# Patient Record
Sex: Female | Born: 1940 | Race: White | Hispanic: No | Marital: Married | Smoking: Never smoker
Health system: Southern US, Community
[De-identification: ages and names within clinical notes are randomized; demographics above are authoritative.]

## PROBLEM LIST (undated history)

## (undated) DIAGNOSIS — I341 Nonrheumatic mitral (valve) prolapse: Secondary | ICD-10-CM

## (undated) DIAGNOSIS — E039 Hypothyroidism, unspecified: Secondary | ICD-10-CM

## (undated) DIAGNOSIS — B029 Zoster without complications: Secondary | ICD-10-CM

## (undated) DIAGNOSIS — I10 Essential (primary) hypertension: Secondary | ICD-10-CM

## (undated) DIAGNOSIS — IMO0002 Reserved for concepts with insufficient information to code with codable children: Secondary | ICD-10-CM

## (undated) DIAGNOSIS — R011 Cardiac murmur, unspecified: Secondary | ICD-10-CM

## (undated) DIAGNOSIS — Z8619 Personal history of other infectious and parasitic diseases: Secondary | ICD-10-CM

## (undated) HISTORY — DX: Personal history of other infectious and parasitic diseases: Z86.19

## (undated) HISTORY — PX: THYROIDECTOMY: SHX17

## (undated) HISTORY — DX: Cardiac murmur, unspecified: R01.1

## (undated) HISTORY — DX: Reserved for concepts with insufficient information to code with codable children: IMO0002

## (undated) HISTORY — PX: SQUAMOUS CELL CARCINOMA EXCISION: SHX2433

## (undated) HISTORY — PX: EYE SURGERY: SHX253

## (undated) HISTORY — PX: BREAST CYST ASPIRATION: SHX578

## (undated) HISTORY — DX: Zoster without complications: B02.9

---

## 1973-01-24 DIAGNOSIS — E890 Postprocedural hypothyroidism: Secondary | ICD-10-CM | POA: Insufficient documentation

## 2004-04-08 ENCOUNTER — Ambulatory Visit: Payer: Self-pay | Admitting: Family Medicine

## 2005-04-11 ENCOUNTER — Ambulatory Visit: Payer: Self-pay | Admitting: Family Medicine

## 2006-02-01 ENCOUNTER — Ambulatory Visit: Payer: Self-pay | Admitting: Gastroenterology

## 2006-02-01 LAB — HM COLONOSCOPY

## 2006-05-01 ENCOUNTER — Ambulatory Visit: Payer: Self-pay | Admitting: Family Medicine

## 2006-05-03 ENCOUNTER — Ambulatory Visit: Payer: Self-pay | Admitting: Family Medicine

## 2006-11-06 ENCOUNTER — Ambulatory Visit: Payer: Self-pay | Admitting: Family Medicine

## 2007-03-20 DIAGNOSIS — K2100 Gastro-esophageal reflux disease with esophagitis, without bleeding: Secondary | ICD-10-CM | POA: Insufficient documentation

## 2007-04-27 ENCOUNTER — Ambulatory Visit: Payer: Self-pay | Admitting: Family Medicine

## 2007-05-02 ENCOUNTER — Ambulatory Visit: Payer: Self-pay | Admitting: Family Medicine

## 2007-06-13 ENCOUNTER — Ambulatory Visit: Payer: Self-pay | Admitting: Gastroenterology

## 2007-06-13 HISTORY — PX: UPPER GI ENDOSCOPY: SHX6162

## 2007-12-05 DIAGNOSIS — M8580 Other specified disorders of bone density and structure, unspecified site: Secondary | ICD-10-CM | POA: Insufficient documentation

## 2008-05-06 ENCOUNTER — Ambulatory Visit: Payer: Self-pay | Admitting: Family Medicine

## 2008-12-12 DIAGNOSIS — B029 Zoster without complications: Secondary | ICD-10-CM | POA: Insufficient documentation

## 2008-12-12 HISTORY — DX: Zoster without complications: B02.9

## 2009-05-07 ENCOUNTER — Ambulatory Visit: Payer: Self-pay | Admitting: Family Medicine

## 2010-05-11 ENCOUNTER — Ambulatory Visit: Payer: Self-pay | Admitting: Family Medicine

## 2011-05-24 ENCOUNTER — Ambulatory Visit: Payer: Self-pay | Admitting: Family Medicine

## 2011-09-16 LAB — LIPID PANEL
Cholesterol: 234 mg/dL — AB (ref 0–200)
HDL: 120 mg/dL — AB (ref 35–70)
LDL Cholesterol: 98 mg/dL
Triglycerides: 81 mg/dL (ref 40–160)

## 2012-05-04 ENCOUNTER — Ambulatory Visit: Payer: Self-pay | Admitting: Family Medicine

## 2012-05-24 ENCOUNTER — Ambulatory Visit: Payer: Self-pay | Admitting: Family Medicine

## 2013-01-10 ENCOUNTER — Ambulatory Visit: Payer: Self-pay | Admitting: Family Medicine

## 2013-01-10 LAB — HM DEXA SCAN

## 2013-05-27 ENCOUNTER — Ambulatory Visit: Payer: Self-pay | Admitting: Family Medicine

## 2014-03-20 LAB — BASIC METABOLIC PANEL
BUN: 11 mg/dL (ref 4–21)
CREATININE: 1 mg/dL (ref 0.5–1.1)
GLUCOSE: 90 mg/dL
POTASSIUM: 4.3 mmol/L (ref 3.4–5.3)
SODIUM: 140 mmol/L (ref 137–147)

## 2014-03-20 LAB — TSH: TSH: 3.4 u[IU]/mL (ref 0.41–5.90)

## 2014-05-09 ENCOUNTER — Other Ambulatory Visit: Payer: Self-pay | Admitting: Family Medicine

## 2014-05-09 DIAGNOSIS — Z1231 Encounter for screening mammogram for malignant neoplasm of breast: Secondary | ICD-10-CM

## 2014-05-29 ENCOUNTER — Ambulatory Visit
Admission: RE | Admit: 2014-05-29 | Discharge: 2014-05-29 | Disposition: A | Discharge status: Home / Self Care | Payer: Medicare Other | Source: Ambulatory Visit | Attending: Family Medicine | Admitting: Family Medicine

## 2014-05-29 ENCOUNTER — Other Ambulatory Visit: Payer: Self-pay | Admitting: Family Medicine

## 2014-05-29 DIAGNOSIS — Z1231 Encounter for screening mammogram for malignant neoplasm of breast: Secondary | ICD-10-CM

## 2015-03-05 DIAGNOSIS — R7989 Other specified abnormal findings of blood chemistry: Secondary | ICD-10-CM | POA: Insufficient documentation

## 2015-03-05 DIAGNOSIS — R945 Abnormal results of liver function studies: Secondary | ICD-10-CM | POA: Insufficient documentation

## 2015-03-23 ENCOUNTER — Encounter: Payer: Self-pay | Admitting: Family Medicine

## 2015-03-23 ENCOUNTER — Ambulatory Visit (INDEPENDENT_AMBULATORY_CARE_PROVIDER_SITE_OTHER): Payer: Medicare HMO | Admitting: Family Medicine

## 2015-03-23 VITALS — BP 140/78 | HR 89 | Temp 98.3°F | Resp 16 | Ht 60.15 in | Wt 110.0 lb

## 2015-03-23 DIAGNOSIS — Z0000 Encounter for general adult medical examination without abnormal findings: Secondary | ICD-10-CM

## 2015-03-23 DIAGNOSIS — I059 Rheumatic mitral valve disease, unspecified: Secondary | ICD-10-CM | POA: Insufficient documentation

## 2015-03-23 DIAGNOSIS — E890 Postprocedural hypothyroidism: Secondary | ICD-10-CM

## 2015-03-23 DIAGNOSIS — I341 Nonrheumatic mitral (valve) prolapse: Secondary | ICD-10-CM | POA: Insufficient documentation

## 2015-03-23 MED ORDER — LEVOTHYROXINE SODIUM 100 MCG PO TABS: 100.0000 ug | ORAL_TABLET | Freq: Every day | ORAL | Status: DC

## 2015-03-23 NOTE — Progress Notes (Signed)
Patient: Suzanne Morgan, Female    DOB: 1940-07-07, 75 y.o.   MRN: [id number] Visit Date: 03/23/2015  Today's Provider: Lelon Huh, MD   Chief Complaint  Patient presents with  . Medicare Wellness  . Hypothyroidism   Subjective:    Annual physical  Suzanne Morgan is a 75 y.o. female. She feels fairly well. She reports exercising yes/walking. She reports she is sleeping fairly well.  -----------------------------------------------------------   Follow-up for hypothyroidism from 03/20/2014; no changes. Follow-up for osteopenia from 03/20/2014; no changes  Review of Systems  Constitutional: Negative.   HENT: Negative.   Eyes: Negative.   Respiratory: Negative.   Cardiovascular: Negative.   Gastrointestinal: Negative.   Endocrine: Negative.   Genitourinary: Negative.   Musculoskeletal: Positive for joint swelling.  Skin: Negative.   Allergic/Immunologic: Negative.   Neurological: Negative.   Hematological: Negative.   Psychiatric/Behavioral: Negative.     Social History   Social History  . Marital Status: Married    Spouse Name: N/A  . Number of Children: N/A  . Years of Education: N/A   Occupational History  . Not on file.   Social History Main Topics  . Smoking status: Never Smoker   . Smokeless tobacco: Not on file  . Alcohol Use: 0.0 oz/week    0 Standard drinks or equivalent per week  . Drug Use: No  . Sexual Activity: Not on file   Other Topics Concern  . Not on file   Social History Narrative    Past Medical History  Diagnosis Date  . History of chicken pox   . History of mumps      Patient Active Problem List   Diagnosis Date Noted  . 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 Abnormal LFTs 03/05/2015  . Herpes zoster without complication [id number]  . Osteopenia 12/05/2007  . Esophagitis, reflux 03/20/2007  . Hypothyroidism, postop 01/24/1973    Past Surgical History  Procedure Laterality Date  . Breast cyst aspiration Right  many yrs ago    negative  . Thyroidectomy    . Upper gi endoscopy  06/13/07    HIATAL HERNIA, IRREGEULAR Z-LINE    Her family history includes Heart attack in her father; Stroke in her mother.    Previous Medications   CALCIUM CARBONATE (OS-CAL) 600 MG TABS TABLET       GLUCOSAMINE 750 MG TABS    Take by mouth.   LEVOTHYROXINE (SYNTHROID, LEVOTHROID) 100 MCG TABLET    Take by mouth.   MULTIPLE VITAMIN TABLET        Patient Care Team: Birdie Sons, MD as PCP - General (Family Medicine)     Objective:   Vitals: BP 140/78 mmHg  Pulse 89  Temp(Src) 98.3 F (36.8 C) (Oral)  Resp 16  Ht 5' 0.15" (1.528 m)  Wt 110 lb (49.896 kg)  BMI 21.37 kg/m2  SpO2 98%  Physical Exam   General Appearance:    Alert, cooperative, no distress, appears stated age  Head:    Normocephalic, without obvious abnormality, atraumatic  Eyes:    PERRL, conjunctiva/corneas clear, EOM's intact, fundi    benign, both eyes  Ears:    Normal TM's and external ear canals, both ears  Nose:   Nares normal, septum midline, mucosa normal, no drainage    or sinus tenderness  Throat:   Lips, mucosa, and tongue normal; teeth and gums normal  Neck:   Supple, symmetrical, trachea midline, no adenopathy;  thyroid:  no enlargement/tenderness/nodules; no carotid   bruit or JVD  Back:     Symmetric, no curvature, ROM normal, no CVA tenderness  Lungs:     Clear to auscultation bilaterally, respirations unlabored  Chest Wall:    No tenderness or deformity   Heart:    Regular rate and rhythm, S1 and S2 normal, no murmur, rub   or gallop  Breast Exam:    normal appearance, no masses or tenderness  Abdomen:     Soft, non-tender, bowel sounds active all four quadrants,    no masses, no organomegaly  Pelvic:    deferred  Extremities:   Extremities normal, atraumatic, no cyanosis or edema  Pulses:   2+ and symmetric all extremities  Skin:   Skin color, texture, turgor normal, no rashes or lesions  Lymph nodes:    Cervical, supraclavicular, and axillary nodes normal  Neurologic:   CNII-XII intact, normal strength, sensation and reflexes    throughout     Activities of Daily Living In your present state of health, do you have any difficulty performing the following activities: 03/23/2015  Hearing? N  Vision? N  Difficulty concentrating or making decisions? N  Walking or climbing stairs? N  Dressing or bathing? N  Doing errands, shopping? N    Fall Risk Assessment Fall Risk  03/23/2015  Falls in the past year? No     Depression Screen PHQ 2/9 Scores 03/23/2015  PHQ - 2 Score 0  PHQ- 9 Score 1    Cognitive Testing - 6-CIT  Correct? Score   What year is it? yes 0 0 or 4  What month is it? yes 0 0 or 3  Memorize:    Suzanne Morgan,  41,  [place],      What time is it? (within 1 hour) yes 0 0 or 3  Count backwards from 20 yes 0 0, 2, or 4  Name the months of the year yes 0 0, 2, or 4  Repeat name & address above yes 0 0, 2, 4, 6, 8, or 10       TOTAL SCORE  0/28   Interpretation:  Normal  Normal (0-7) Abnormal (8-28)    Audit-C Alcohol Use Screening  Question Answer Points  How often do you have alcoholic drink? 2-3 times weekly 3  On days you do drink alcohol, how many drinks do you typically consume? n/a 0  How oftey will you drink 6 or more in a total? never 0  Total Score:  3   A score of 3 or more in women, and 4 or more in men indicates increased risk for alcohol abuse, EXCEPT if all of the points are from question 1.     Assessment & Plan:    Annual Physical Reviewed patient's Family Medical History Reviewed and updated list of patient's medical providers Assessment of cognitive impairment was done Assessed patient's functional ability Established a written schedule for health screening [place] Completed and Reviewed  Exercise Activities and Dietary recommendations Goals    None      Immunization History  Administered Date(s)  Administered  . Pneumococcal Conjugate-13 03/20/2014  . Pneumococcal Polysaccharide-23 11/19/2003, 12/27/2010  . Td 12/09/2008  . Zoster 02/08/2006    Health Maintenance  Topic Date Due  . DEXA SCAN  08/10/2004  . INFLUENZA VACCINE  08/25/2014  . COLONOSCOPY  02/02/2016  . TETANUS/TDAP  12/10/2018  . ZOSTAVAX  Completed  . PNA  vac Low Risk Adult  Completed      Discussed health benefits of physical activity, and encouraged her to engage in regular exercise appropriate for her age and condition.    --------------------------------------------------------------------------------   1. Annual physical exam Doing well - Comprehensive metabolic panel - Lipid panel  2. Hypothyroidism, postop Continue current medications and check TSH - TSH

## 2015-04-20 ENCOUNTER — Other Ambulatory Visit: Payer: Self-pay | Admitting: Family Medicine

## 2015-04-20 DIAGNOSIS — Z1231 Encounter for screening mammogram for malignant neoplasm of breast: Secondary | ICD-10-CM

## 2015-05-18 DIAGNOSIS — Z0000 Encounter for general adult medical examination without abnormal findings: Secondary | ICD-10-CM | POA: Diagnosis not present

## 2015-05-18 DIAGNOSIS — E890 Postprocedural hypothyroidism: Secondary | ICD-10-CM | POA: Diagnosis not present

## 2015-05-19 DIAGNOSIS — R69 Illness, unspecified: Secondary | ICD-10-CM | POA: Diagnosis not present

## 2015-05-19 LAB — COMPREHENSIVE METABOLIC PANEL
ALBUMIN: 4.6 g/dL (ref 3.5–4.8)
ALK PHOS: 72 IU/L (ref 39–117)
ALT: 19 IU/L (ref 0–32)
AST: 25 IU/L (ref 0–40)
Albumin/Globulin Ratio: 1.8 (ref 1.2–2.2)
BUN / CREAT RATIO: 17 (ref 12–28)
BUN: 16 mg/dL (ref 8–27)
Bilirubin Total: 0.7 mg/dL (ref 0.0–1.2)
CALCIUM: 9.2 mg/dL (ref 8.7–10.3)
CHLORIDE: 100 mmol/L (ref 96–106)
CO2: 28 mmol/L (ref 18–29)
CREATININE: 0.96 mg/dL (ref 0.57–1.00)
GFR calc Af Amer: 67 mL/min/{1.73_m2} (ref >59)
GFR, EST NON AFRICAN AMERICAN: 58 mL/min/{1.73_m2} — AB (ref >59)
GLUCOSE: 96 mg/dL (ref 65–99)
Globulin, Total: 2.5 g/dL (ref 1.5–4.5)
POTASSIUM: 4.8 mmol/L (ref 3.5–5.2)
SODIUM: 142 mmol/L (ref 134–144)
Total Protein: 7.1 g/dL (ref 6.0–8.5)

## 2015-05-19 LAB — LIPID PANEL
Chol/HDL Ratio: 1.7 ratio units (ref 0.0–4.4)
Cholesterol, Total: 221 mg/dL — ABNORMAL HIGH (ref 100–199)
HDL: 127 mg/dL (ref >39)
LDL Calculated: 79 mg/dL (ref 0–99)
Triglycerides: 74 mg/dL (ref 0–149)
VLDL Cholesterol Cal: 15 mg/dL (ref 5–40)

## 2015-05-19 LAB — TSH: TSH: 2.92 u[IU]/mL (ref 0.450–4.500)

## 2015-06-01 ENCOUNTER — Ambulatory Visit
Admission: RE | Admit: 2015-06-01 | Discharge: 2015-06-01 | Disposition: A | Discharge status: Home / Self Care | Payer: Medicare HMO | Source: Ambulatory Visit | Attending: Family Medicine | Admitting: Family Medicine

## 2015-06-01 DIAGNOSIS — Z1231 Encounter for screening mammogram for malignant neoplasm of breast: Secondary | ICD-10-CM | POA: Diagnosis not present

## 2015-06-04 ENCOUNTER — Ambulatory Visit: Payer: Medicare Other

## 2015-06-08 DIAGNOSIS — M720 Palmar fascial fibromatosis [Dupuytren]: Secondary | ICD-10-CM | POA: Diagnosis not present

## 2015-06-08 DIAGNOSIS — M65332 Trigger finger, left middle finger: Secondary | ICD-10-CM | POA: Diagnosis not present

## 2015-07-21 DIAGNOSIS — H2513 Age-related nuclear cataract, bilateral: Secondary | ICD-10-CM | POA: Diagnosis not present

## 2015-10-30 DIAGNOSIS — R69 Illness, unspecified: Secondary | ICD-10-CM | POA: Diagnosis not present

## 2015-11-18 DIAGNOSIS — Z1283 Encounter for screening for malignant neoplasm of skin: Secondary | ICD-10-CM | POA: Diagnosis not present

## 2015-11-18 DIAGNOSIS — L821 Other seborrheic keratosis: Secondary | ICD-10-CM | POA: Diagnosis not present

## 2015-11-18 DIAGNOSIS — L570 Actinic keratosis: Secondary | ICD-10-CM | POA: Diagnosis not present

## 2016-02-18 ENCOUNTER — Other Ambulatory Visit: Payer: Self-pay | Admitting: Family Medicine

## 2016-02-18 MED ORDER — LEVOTHYROXINE SODIUM 100 MCG PO TABS: 100.0000 ug | ORAL_TABLET | Freq: Every day | ORAL | 4 refills | Status: DC

## 2016-02-18 NOTE — Telephone Encounter (Signed)
Pt contacted office for refill request on the following medications:  levothyroxine (SYNTHROID, LEVOTHROID) 100 MCG tablet.  [facility].  [id number]

## 2016-03-24 ENCOUNTER — Encounter: Payer: Self-pay | Admitting: Family Medicine

## 2016-03-24 ENCOUNTER — Ambulatory Visit (INDEPENDENT_AMBULATORY_CARE_PROVIDER_SITE_OTHER): Payer: Medicare HMO | Admitting: Family Medicine

## 2016-03-24 VITALS — BP 142/78 | HR 86 | Temp 98.6°F | Resp 16 | Ht 60.0 in | Wt 105.0 lb

## 2016-03-24 DIAGNOSIS — E2839 Other primary ovarian failure: Secondary | ICD-10-CM | POA: Diagnosis not present

## 2016-03-24 DIAGNOSIS — E890 Postprocedural hypothyroidism: Secondary | ICD-10-CM

## 2016-03-24 DIAGNOSIS — Z0000 Encounter for general adult medical examination without abnormal findings: Secondary | ICD-10-CM

## 2016-03-24 DIAGNOSIS — I059 Rheumatic mitral valve disease, unspecified: Secondary | ICD-10-CM | POA: Diagnosis not present

## 2016-03-24 NOTE — Progress Notes (Signed)
Patient: Suzanne Morgan, Female    DOB: 1940/05/11, 76 y.o.   MRN: [id number] Visit Date: 03/24/2016  Today's Provider: Lelon Huh, MD   Chief Complaint  Patient presents with  . Annual Exam  . Hypothyroidism  . Osteopenia   Subjective:    Annual wellness visit Suzanne Morgan is a 76 y.o. female. She feels fairly well. She reports exercising occasionally. She reports she is sleeping well.   Hypothyroidism, follow up: Patient was last seen for this 1 year ago. No changes were made in her medications. Patient is currently taking levothyroxine 129mcg daily, and reports good symptom control.  Osteopenia, follow up: Patient was last seen for this 1 year ago. No changes were made in her medications. Patient is currently taking calcium supplements OTC, and reports good symptom control.    Review of Systems  Constitutional: Negative.   HENT: Negative.   Eyes: Negative.   Respiratory: Negative.   Cardiovascular: Negative.   Gastrointestinal: Negative.   Endocrine: Negative.   Genitourinary: Negative.   Musculoskeletal: Negative.   Skin: Negative.   Allergic/Immunologic: Negative.   Neurological: Negative.   Hematological: Negative.   Psychiatric/Behavioral: Negative.     Social History   Social History  . Marital status: Married    Spouse name: N/A  . Number of children: N/A  . Years of education: N/A   Occupational History  . Not on file.   Social History Main Topics  . Smoking status: Never Smoker  . Smokeless tobacco: Never Used  . Alcohol use 0.0 oz/week  . Drug use: No  . Sexual activity: Not on file   Other Topics Concern  . Not on file   Social History Narrative  . No narrative on file    Past Medical History:  Diagnosis Date  . Herpes zoster without complication [id number]  . History of chicken pox   . History of mumps      Patient Active Problem List   Diagnosis Date Noted  . 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 Abnormal LFTs  03/05/2015  . Osteopenia 12/05/2007  . Esophagitis, reflux 03/20/2007  . Hypothyroidism, postop 01/24/1973    Past Surgical History:  Procedure Laterality Date  . BREAST CYST ASPIRATION Right many yrs ago   negative  . THYROIDECTOMY    . UPPER GI ENDOSCOPY  06/13/07   HIATAL HERNIA, IRREGEULAR Z-LINE    Her family history includes Heart attack in her father; Stroke in her mother.      Current Outpatient Prescriptions:  Marland Kitchen  Glucosamine 750 MG TABS, Take by mouth., Disp: , Rfl:  .  levothyroxine (SYNTHROID, LEVOTHROID) 100 MCG tablet, Take 1 tablet (100 mcg total) by mouth daily., Disp: 90 tablet, Rfl: 4 .  Multiple Vitamin tablet, , Disp: , Rfl:  .  calcium carbonate (OS-CAL) 600 MG TABS tablet, , Disp: , Rfl:   Patient Care Team: Birdie Sons, MD as PCP - General (Family Medicine)     Objective:   Vitals: BP (!) 142/78 (BP Location: Right Arm, Patient Position: Sitting, Cuff Size: Normal)   Pulse 86   Temp 98.6 F (37 C)   Resp 16   Ht 5' (1.524 m)   Wt 105 lb (47.6 kg)   BMI 20.51 kg/m   Physical Exam  Constitutional: She is oriented to person, place, and time. She appears well-developed and well-nourished.  HENT:  Head: Normocephalic and atraumatic.  Right Ear: External ear normal.  Left Ear: External  ear normal.  Nose: Nose normal.  Mouth/Throat: Oropharynx is clear and moist.  Eyes: Conjunctivae and EOM are normal. Pupils are equal, round, and reactive to light.  Neck: Normal range of motion. Neck supple.  Cardiovascular: Normal rate and regular rhythm.   Murmur (2/6 mid systolic) heard. Pulmonary/Chest: Effort normal and breath sounds normal. Right breast exhibits no inverted nipple, no mass, no nipple discharge, no skin change and no tenderness. Left breast exhibits no inverted nipple, no mass, no nipple discharge, no skin change and no tenderness. Breasts are symmetrical.  Abdominal: Soft. Bowel sounds are normal.  Musculoskeletal: Normal range of motion.   Neurological: She is alert and oriented to person, place, and time.  Skin: Skin is warm and dry.  Psychiatric: She has a normal mood and affect. Her behavior is normal. Judgment and thought content normal.     Activities of Daily Living In your present state of health, do you have any difficulty performing the following activities: 03/24/2016  Hearing? N  Vision? N  Difficulty concentrating or making decisions? N  Walking or climbing stairs? N  Dressing or bathing? N  Doing errands, shopping? N  Some recent data might be hidden    Fall Risk Assessment Fall Risk  03/24/2016 03/23/2015  Falls in the past year? No No     Depression Screen PHQ 2/9 Scores 03/24/2016 03/24/2016 03/23/2015  PHQ - 2 Score 0 0 0  PHQ- 9 Score 1 - 1    Cognitive Testing - 6-CIT  Correct? Score   What year is it? yes 0 0 or 4  What month is it? yes 0 0 or 3  Memorize:    Suzanne Morgan,  41,  [place] [address]  [place],      What time is it? (within 1 hour) yes 0 0 or 3  Count backwards from 20 yes 0 0, 2, or 4  Name the months of the year yes 0 0, 2, or 4  Repeat name & address above yes 0 0, 2, 4, 6, 8, or 10       TOTAL SCORE  0/28   Interpretation:  Normal  Normal (0-7) Abnormal (8-28)       Assessment & Plan:    Annual Physical Reviewed patient's Family Medical History Reviewed and updated list of patient's medical providers Assessment of cognitive impairment was done Assessed patient's functional ability Established a written schedule for health screening [place] Completed and Reviewed  Exercise Activities and Dietary recommendations Goals    None      Immunization History  Administered Date(s) Administered  . Influenza, High Dose Seasonal PF 10/30/2015  . Pneumococcal Conjugate-13 03/20/2014  . Pneumococcal Polysaccharide-23 11/19/2003, 12/27/2010  . Td 12/09/2008  . Zoster 02/08/2006    Health Maintenance  Topic Date Due  . INFLUENZA VACCINE  08/25/2015  .  TETANUS/TDAP  12/10/2018  . DEXA SCAN  Completed  . PNA vac Low Risk Adult  Completed     Discussed health benefits of physical activity, and encouraged her to engage in regular exercise appropriate for her age and condition.     1. Annual physical exam  - Comprehensive metabolic panel - TSH  2. Hypothyroidism, postop  - Comprehensive metabolic panel - TSH  3. Mitral valve disorder   4. Estrogen deficiency  - DG Bone Density; Future    Lelon Huh, MD  [facility]

## 2016-03-24 NOTE — Patient Instructions (Signed)
Please call the [facility] ([phone]) to schedule a routine screening mammogram.  

## 2016-04-12 ENCOUNTER — Other Ambulatory Visit: Payer: Self-pay | Admitting: Family Medicine

## 2016-04-12 DIAGNOSIS — Z1231 Encounter for screening mammogram for malignant neoplasm of breast: Secondary | ICD-10-CM

## 2016-04-22 DIAGNOSIS — E890 Postprocedural hypothyroidism: Secondary | ICD-10-CM | POA: Diagnosis not present

## 2016-04-22 DIAGNOSIS — Z0000 Encounter for general adult medical examination without abnormal findings: Secondary | ICD-10-CM | POA: Diagnosis not present

## 2016-04-23 LAB — COMPREHENSIVE METABOLIC PANEL
ALT: 18 IU/L (ref 0–32)
AST: 23 IU/L (ref 0–40)
Albumin/Globulin Ratio: 1.9 (ref 1.2–2.2)
Albumin: 4.5 g/dL (ref 3.5–4.8)
Alkaline Phosphatase: 69 IU/L (ref 39–117)
BUN/Creatinine Ratio: 12 (ref 12–28)
BUN: 11 mg/dL (ref 8–27)
Bilirubin Total: 0.7 mg/dL (ref 0.0–1.2)
CALCIUM: 9.3 mg/dL (ref 8.7–10.3)
CO2: 26 mmol/L (ref 18–29)
CREATININE: 0.93 mg/dL (ref 0.57–1.00)
Chloride: 101 mmol/L (ref 96–106)
GFR calc Af Amer: 69 mL/min/{1.73_m2} (ref >59)
GFR, EST NON AFRICAN AMERICAN: 60 mL/min/{1.73_m2} (ref >59)
GLUCOSE: 94 mg/dL (ref 65–99)
Globulin, Total: 2.4 g/dL (ref 1.5–4.5)
Potassium: 4.3 mmol/L (ref 3.5–5.2)
Sodium: 142 mmol/L (ref 134–144)
Total Protein: 6.9 g/dL (ref 6.0–8.5)

## 2016-04-23 LAB — TSH: TSH: 1.98 u[IU]/mL (ref 0.450–4.500)

## 2016-04-25 ENCOUNTER — Ambulatory Visit: Payer: Medicare HMO

## 2016-05-04 DIAGNOSIS — R69 Illness, unspecified: Secondary | ICD-10-CM | POA: Diagnosis not present

## 2016-05-16 ENCOUNTER — Ambulatory Visit
Admission: RE | Admit: 2016-05-16 | Discharge: 2016-05-16 | Disposition: A | Discharge status: Home / Self Care | Payer: Medicare HMO | Source: Ambulatory Visit | Attending: Family Medicine | Admitting: Family Medicine

## 2016-05-16 DIAGNOSIS — Z13820 Encounter for screening for osteoporosis: Secondary | ICD-10-CM | POA: Insufficient documentation

## 2016-05-16 DIAGNOSIS — M8580 Other specified disorders of bone density and structure, unspecified site: Secondary | ICD-10-CM | POA: Diagnosis not present

## 2016-05-16 DIAGNOSIS — Z780 Asymptomatic menopausal state: Secondary | ICD-10-CM | POA: Diagnosis not present

## 2016-05-16 DIAGNOSIS — M85852 Other specified disorders of bone density and structure, left thigh: Secondary | ICD-10-CM | POA: Diagnosis not present

## 2016-05-16 DIAGNOSIS — E2839 Other primary ovarian failure: Secondary | ICD-10-CM

## 2016-06-01 ENCOUNTER — Ambulatory Visit
Admission: RE | Admit: 2016-06-01 | Discharge: 2016-06-01 | Disposition: A | Discharge status: Home / Self Care | Payer: Medicare HMO | Source: Ambulatory Visit | Attending: Family Medicine | Admitting: Family Medicine

## 2016-06-01 DIAGNOSIS — Z1231 Encounter for screening mammogram for malignant neoplasm of breast: Secondary | ICD-10-CM | POA: Diagnosis not present

## 2016-07-25 DIAGNOSIS — H2513 Age-related nuclear cataract, bilateral: Secondary | ICD-10-CM | POA: Diagnosis not present

## 2016-11-14 DIAGNOSIS — R69 Illness, unspecified: Secondary | ICD-10-CM | POA: Diagnosis not present

## 2016-11-21 DIAGNOSIS — L570 Actinic keratosis: Secondary | ICD-10-CM | POA: Diagnosis not present

## 2016-11-21 DIAGNOSIS — I781 Nevus, non-neoplastic: Secondary | ICD-10-CM | POA: Diagnosis not present

## 2016-11-21 DIAGNOSIS — Z872 Personal history of diseases of the skin and subcutaneous tissue: Secondary | ICD-10-CM | POA: Diagnosis not present

## 2016-11-21 DIAGNOSIS — L578 Other skin changes due to chronic exposure to nonionizing radiation: Secondary | ICD-10-CM | POA: Diagnosis not present

## 2016-11-21 DIAGNOSIS — L821 Other seborrheic keratosis: Secondary | ICD-10-CM | POA: Diagnosis not present

## 2017-01-04 ENCOUNTER — Other Ambulatory Visit: Payer: Self-pay | Admitting: Family Medicine

## 2017-01-04 MED ORDER — LEVOTHYROXINE SODIUM 100 MCG PO TABS: 100.0000 ug | ORAL_TABLET | Freq: Every day | ORAL | 4 refills | Status: DC

## 2017-01-04 NOTE — Telephone Encounter (Signed)
[facility] [address] [place] faxed refill request for following medications: levothyroxine (SYNTHROID, LEVOTHROID) 100 MCG tablet     Please advise,Thanks 

## 2017-01-04 NOTE — Telephone Encounter (Signed)
Last RF 02/18/16 and last OV 03/24/16

## 2017-02-28 ENCOUNTER — Telehealth: Payer: Self-pay | Admitting: Family Medicine

## 2017-03-15 DIAGNOSIS — D485 Neoplasm of uncertain behavior of skin: Secondary | ICD-10-CM | POA: Diagnosis not present

## 2017-03-15 DIAGNOSIS — C44622 Squamous cell carcinoma of skin of right upper limb, including shoulder: Secondary | ICD-10-CM | POA: Diagnosis not present

## 2017-03-24 ENCOUNTER — Ambulatory Visit: Payer: Medicare HMO

## 2017-03-27 ENCOUNTER — Encounter: Payer: Medicare HMO | Admitting: Family Medicine

## 2017-03-27 DIAGNOSIS — L0390 Cellulitis, unspecified: Secondary | ICD-10-CM | POA: Diagnosis not present

## 2017-04-10 ENCOUNTER — Ambulatory Visit (INDEPENDENT_AMBULATORY_CARE_PROVIDER_SITE_OTHER): Payer: Medicare HMO | Admitting: Family Medicine

## 2017-04-10 ENCOUNTER — Encounter: Payer: Self-pay | Admitting: Family Medicine

## 2017-04-10 ENCOUNTER — Ambulatory Visit (INDEPENDENT_AMBULATORY_CARE_PROVIDER_SITE_OTHER): Payer: Medicare HMO

## 2017-04-10 VITALS — BP 148/80 | HR 92 | Temp 98.0°F | Ht 60.0 in

## 2017-04-10 VITALS — BP 156/84

## 2017-04-10 DIAGNOSIS — I10 Essential (primary) hypertension: Secondary | ICD-10-CM | POA: Diagnosis not present

## 2017-04-10 DIAGNOSIS — Z0000 Encounter for general adult medical examination without abnormal findings: Secondary | ICD-10-CM

## 2017-04-10 DIAGNOSIS — E890 Postprocedural hypothyroidism: Secondary | ICD-10-CM | POA: Diagnosis not present

## 2017-04-10 MED ORDER — AMLODIPINE BESYLATE 2.5 MG PO TABS: 2.5000 mg | ORAL_TABLET | Freq: Every day | ORAL | 3 refills | Status: DC

## 2017-04-10 NOTE — Progress Notes (Addendum)
Subjective:   Suzanne Morgan is a 77 y.o. female who presents for Medicare Annual (Subsequent) preventive examination.  Review of Systems:   N/A  Cardiac Risk Factors include: advanced age (>29men, >98 women)     Objective:     Vitals: BP (!) 148/80 (BP Location: Left Arm)   Pulse 92   Temp 98 F (36.7 C) (Oral)   Ht 5' (1.524 m)   BMI 20.51 kg/m   Body mass index is 20.51 kg/m.  Advanced Directives 04/10/2017  Does Patient Have a Medical Advance Directive? Yes  Type of Advance Directive Living will;Healthcare Power of [place] in Chart? No - copy requested    Tobacco Social History   Tobacco Use  Smoking Status Never Smoker  Smokeless Tobacco Never Used     Counseling given: Not Answered   Clinical Intake:  Pre-visit preparation completed: Yes  Pain : No/denies pain Pain Score: 0-No pain     Nutritional Status: BMI of 19-24  Normal Nutritional Risks: None Diabetes: No  How often do you need to have someone help you when you read instructions, pamphlets, or other written materials from your doctor or pharmacy?: 1 - Never  Interpreter Needed?: No  Information entered by :: [facility], LPN  Past Medical History:  Diagnosis Date  . Herpes zoster without complication 02/01/3233  . History of chicken pox   . History of mumps   . Squamous cell carcinoma    Past Surgical History:  Procedure Laterality Date  . BREAST CYST ASPIRATION Right many yrs ago   negative  . THYROIDECTOMY    . UPPER GI ENDOSCOPY  06/13/07   HIATAL HERNIA, IRREGEULAR Z-LINE   Family History  Problem Relation Age of Onset  . Stroke Mother   . Heart attack Father    Social History   Socioeconomic History  . Marital status: Married    Spouse name: None  . Number of children: 1  . Years of education: None  . Highest education level: Bachelor's degree (e.g., BA, AB, BS)  Social Needs  . Financial resource strain: Not hard at all  .  Food insecurity - worry: Never true  . Food insecurity - inability: Never true  . Transportation needs - medical: No  . Transportation needs - non-medical: No  Occupational History  . None  Tobacco Use  . Smoking status: Never Smoker  . Smokeless tobacco: Never Used  Substance and Sexual Activity  . Alcohol use: Yes    Alcohol/week: 0.0 - 0.6 oz  . Drug use: No  . Sexual activity: None  Other Topics Concern  . None  Social History Narrative   Pt as 1 biological child and 2 adopted.    Outpatient Encounter Medications as of 04/10/2017  Medication Sig  . calcium carbonate (OS-CAL) 600 MG TABS tablet Take 600 mg by mouth daily with breakfast.   . Glucosamine 750 MG TABS Take by mouth daily.   . Influenza vac split quadrivalent PF (FLUZONE HIGH-DOSE) 0.5 ML injection Fluzone High-Dose 2016-2017 (PF) 180 mcg/0.5 mL intramuscular syringe  inject 0.5 milliliter intramuscularly  . levothyroxine (SYNTHROID, LEVOTHROID) 100 MCG tablet Take 1 tablet (100 mcg total) by mouth daily.  . Multiple Vitamin tablet Take 1 tablet by mouth daily.    No facility-administered encounter medications on file as of 04/10/2017.     Activities of Daily Living In your present state of health, do you have any difficulty performing the following activities:  04/10/2017  Hearing? N  Vision? N  Difficulty concentrating or making decisions? N  Walking or climbing stairs? N  Dressing or bathing? N  Doing errands, shopping? N  Preparing Food and eating ? N  Using the Toilet? N  In the past six months, have you accidently leaked urine? Y  Comment Occasionally, not enough to have to wear protection.  Do you have problems with loss of bowel control? N  Managing your Medications? N  Managing your Finances? N  Housekeeping or managing your Housekeeping? N  Some recent data might be hidden    Patient Care Team: Birdie Sons, MD as PCP - General (Family Medicine) [place], [facility] as Consulting  Physician (Optometry)    Assessment:   This is a routine wellness examination for Suzanne Morgan.  Exercise Activities and Dietary recommendations Current Exercise Habits: Home exercise routine, Type of exercise: walking, Time (Minutes): 45, Frequency (Times/Week): 4, Weekly Exercise (Minutes/Week): 180, Intensity: Mild, Exercise limited by: None identified  Goals    . DIET - INCREASE WATER INTAKE     Recommend increasing water intake to 4 glasses a day.       Fall Risk Fall Risk  04/10/2017 03/24/2016 03/23/2015  Falls in the past year? No No No   Is the patient's home free of loose throw rugs in walkways, pet beds, electrical cords, etc?   yes      Grab bars in the bathroom? no      Handrails on the stairs?   yes      Adequate lighting?   yes  Timed Get Up and Go performed: N/A  Depression Screen PHQ 2/9 Scores 04/10/2017 04/10/2017 03/24/2016 03/24/2016  PHQ - 2 Score 0 0 0 0  PHQ- 9 Score 1 - 1 -     Cognitive Function     6CIT Screen 04/10/2017  What Year? 0 points  What month? 0 points  What time? 0 points  Count back from 20 0 points  Months in reverse 0 points  Repeat phrase 0 points  Total Score 0    Immunization History  Administered Date(s) Administered  . Influenza Split 11/02/2006  . Influenza, High Dose Seasonal PF 10/30/2015  . Influenza-Unspecified 11/14/2016  . Pneumococcal Conjugate-13 03/20/2014  . Pneumococcal Polysaccharide-23 11/19/2003, 12/27/2010  . Td 12/09/2008  . Zoster 02/08/2006    Qualifies for Shingles Vaccine? Due for Shingles vaccine. Declined my offer to administer today. Education has been provided regarding the importance of this vaccine. Pt has been advised to call her insurance company to determine her out of pocket expense. Advised she may also receive this vaccine at her local pharmacy or Health Dept. Verbalized acceptance and understanding.  Screening Tests Health Maintenance  Topic Date Due  . TETANUS/TDAP  12/10/2018  . INFLUENZA  VACCINE  Completed  . DEXA SCAN  Completed  . PNA vac Low Risk Adult  Completed    Cancer Screenings: Lung: Low Dose CT Chest recommended if Age 50-80 years, 30 pack-year currently smoking OR have quit w/in 15years. Patient does not qualify. Breast:  Up to date on Mammogram? Yes   Up to date of Bone Density/Dexa? Yes Colorectal: Up to date  Additional Screenings:  Hepatitis C Screening: N/A     Plan:  I have personally reviewed and addressed the Medicare Annual Wellness questionnaire and have noted the following in the patient's chart:  A. Medical and social history B. Use of alcohol, tobacco or illicit drugs  C. Current medications  and supplements D. Functional ability and status E.  Nutritional status F.  Physical activity G. Advance directives H. List of other physicians I.  Hospitalizations, surgeries, and ER visits in previous 12 months J.  [place] such as hearing and vision if needed, cognitive and depression L. Referrals and appointments - none  In addition, I have reviewed and discussed with patient certain preventive protocols, quality metrics, and best practice recommendations. A written personalized care plan for preventive services as well as general preventive health recommendations were provided to patient.  See attached scanned questionnaire for additional information.   Signed,  Fabio Neighbors, LPN Nurse Health Advisor   Nurse Recommendations: None.

## 2017-04-10 NOTE — Progress Notes (Signed)
Patient: Suzanne Morgan, Female    DOB: [birth year], 77 y.o.   MRN: [id number] Visit Date: 04/10/2017  Today's Provider: Lelon Huh, MD   Chief Complaint  Patient presents with  . Annual Exam  . Hypothyroidism   Subjective:     Complete Physical HYLA COARD is a 77 y.o. female. She feels well. She reports exercising 4 days a week. She reports she is sleeping fairly well.  ----------------------------------------------------------- Hypothyroidism, postop From 03/24/2016-labs checked, no changes. Patient reports good compliance with treatment and good tolerance. Lab Results  Component Value Date   TSH 1.980 04/22/2016       Review of Systems  Constitutional: Negative for chills, fatigue and fever.  HENT: Negative for congestion, ear pain, rhinorrhea, sneezing and sore throat.   Eyes: Negative.  Negative for pain and redness.  Respiratory: Negative for cough, shortness of breath and wheezing.   Cardiovascular: Negative for chest pain and leg swelling.  Gastrointestinal: Negative for abdominal pain, blood in stool, constipation, diarrhea and nausea.  Endocrine: Negative for polydipsia and polyphagia.  Genitourinary: Negative.  Negative for dysuria, flank pain, hematuria, pelvic pain, vaginal bleeding and vaginal discharge.  Musculoskeletal: Negative for arthralgias, back pain, gait problem and joint swelling.  Skin: Negative for rash.  Neurological: Negative.  Negative for dizziness, tremors, seizures, weakness, light-headedness, numbness and headaches.  Hematological: Negative for adenopathy.  Psychiatric/Behavioral: Negative.  Negative for behavioral problems, confusion and dysphoric mood. The patient is not nervous/anxious and is not hyperactive.     Social History   Socioeconomic History  . Marital status: Married    Spouse name: Not on file  . Number of children: 1  . Years of education: Not on file  . Highest education level: Bachelor's degree (e.g.,  BA, AB, BS)  Social Needs  . Financial resource strain: Not hard at all  . Food insecurity - worry: Never true  . Food insecurity - inability: Never true  . Transportation needs - medical: No  . Transportation needs - non-medical: No  Occupational History  . Not on file  Tobacco Use  . Smoking status: Never Smoker  . Smokeless tobacco: Never Used  Substance and Sexual Activity  . Alcohol use: Yes    Alcohol/week: 0.0 - 0.6 oz  . Drug use: No  . Sexual activity: Not on file  Other Topics Concern  . Not on file  Social History Narrative   Pt as 1 biological child and 2 adopted.    Past Medical History:  Diagnosis Date  . Herpes zoster without complication 63/33/5456  . History of chicken pox   . History of mumps      Patient Active Problem List   Diagnosis Date Noted  . 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 Abnormal LFTs 03/05/2015  . Osteopenia 12/05/2007  . Esophagitis, reflux 03/20/2007  . Hypothyroidism, postop 01/24/1973    Past Surgical History:  Procedure Laterality Date  . BREAST CYST ASPIRATION Right many yrs ago   negative  . THYROIDECTOMY    . UPPER GI ENDOSCOPY  06/13/07   HIATAL HERNIA, IRREGEULAR Z-LINE    Her family history includes Heart attack in her father; Stroke in her mother.      Current Outpatient Medications:  .  calcium carbonate (OS-CAL) 600 MG TABS tablet, Take 600 mg by mouth daily with breakfast. , Disp: , Rfl:  .  Glucosamine 750 MG TABS, Take by mouth daily. , Disp: , Rfl:  .  Influenza  vac split quadrivalent PF (FLUZONE HIGH-DOSE) 0.5 ML injection, Fluzone High-Dose 2016-2017 (PF) 180 mcg/0.5 mL intramuscular syringe  inject 0.5 milliliter intramuscularly, Disp: , Rfl:  .  levothyroxine (SYNTHROID, LEVOTHROID) 100 MCG tablet, Take 1 tablet (100 mcg total) by mouth daily., Disp: 90 tablet, Rfl: 4 .  Multiple Vitamin tablet, Take 1 tablet by mouth daily. , Disp: , Rfl:   Patient Care Team: Birdie Sons, MD as PCP - General  (Family Medicine)     Objective:   Vitals: BP (!) 156/84 (BP Location: Left Arm, Patient Position: Sitting, Cuff Size: Normal)  Most recent update: 04/10/2017 8:42 AM by Fabio Neighbors, LPN  BP  299/37 Abnormal   (BP Location: Left Arm)   Pulse  92   Temp  98 F (36.7 C) (Oral)   Ht  5' (1.524 m)   BMI  20.51 kg/m      Physical Exam     General Appearance:    Alert, cooperative, no distress, appears stated age  Head:    Normocephalic, without obvious abnormality, atraumatic  Eyes:    PERRL, conjunctiva/corneas clear, EOM's intact, fundi    benign, both eyes  Ears:    Normal TM's and external ear canals, both ears  Nose:   Nares normal, septum midline, mucosa normal, no drainage    or sinus tenderness  Throat:   Lips, mucosa, and tongue normal; teeth and gums normal  Neck:   Supple, symmetrical, trachea midline, no adenopathy;    thyroid:  no enlargement/tenderness/nodules; no carotid   bruit or JVD  Back:     Symmetric, no curvature, ROM normal, no CVA tenderness  Lungs:     Clear to auscultation bilaterally, respirations unlabored  Chest Wall:    No tenderness or deformity   Heart:    Regular rate and rhythm, S1 and S2 normal, no murmur, rub   or gallop  Breast Exam:    normal appearance, no masses or tenderness  Abdomen:     Soft, non-tender, bowel sounds active all four quadrants,    no masses, no organomegaly  Pelvic:    deferred  Extremities:   Extremities normal, atraumatic, no cyanosis or edema  Pulses:   2+ and symmetric all extremities  Skin:   Skin color, texture, turgor normal, no rashes or lesions  Lymph nodes:   Cervical, supraclavicular, and axillary nodes normal  Neurologic:   CNII-XII intact, normal strength, sensation and reflexes    throughout   Activities of Daily Living In your present state of health, do you have any difficulty performing the following activities: 04/10/2017  Hearing? N  Vision? N  Difficulty concentrating or making  decisions? N  Walking or climbing stairs? N  Dressing or bathing? N  Doing errands, shopping? N  Preparing Food and eating ? N  Using the Toilet? N  In the past six months, have you accidently leaked urine? Y  Comment Occasionally, not enough to have to wear protection.  Do you have problems with loss of bowel control? N  Managing your Medications? N  Managing your Finances? N  Housekeeping or managing your Housekeeping? N  Some recent data might be hidden    Fall Risk Assessment Fall Risk  04/10/2017 03/24/2016 03/23/2015  Falls in the past year? No No No     Depression Screen PHQ 2/9 Scores 04/10/2017 04/10/2017 03/24/2016 03/24/2016  PHQ - 2 Score 0 0 0 0  PHQ- 9 Score 1 - 1 -    Cognitive Testing -  6-CIT 6CIT Screen 04/10/2017  What Year? 0 points  What month? 0 points  What time? 0 points  Count back from 20 0 points  Months in reverse 0 points  Repeat phrase 0 points  Total Score 0        Assessment & Plan:    Annual Physical Reviewed patient's Family Medical History Reviewed and updated list of patient's medical providers Assessment of cognitive impairment was done Assessed patient's functional ability Established a written schedule for health screening [place] Completed and Reviewed  Exercise Activities and Dietary recommendations Goals    None      Immunization History  Administered Date(s) Administered  . Influenza Split 11/02/2006  . Influenza, High Dose Seasonal PF 10/30/2015  . Influenza-Unspecified 11/14/2016  . Pneumococcal Conjugate-13 03/20/2014  . Pneumococcal Polysaccharide-23 11/19/2003, 12/27/2010  . Td 12/09/2008  . Zoster 02/08/2006    Health Maintenance  Topic Date Due  . TETANUS/TDAP  12/10/2018  . INFLUENZA VACCINE  Completed  . DEXA SCAN  Completed  . PNA vac Low Risk Adult  Completed     Discussed health benefits of physical activity, and encouraged her to engage in regular exercise appropriate for  her age and condition.    ------------------------------------------------------------------------------------------------------------  1. Annual physical exam  - Comprehensive metabolic panel - TSH - Measles/Mumps/Rubella Immunity  2. Essential hypertension Start amlodipine 2.5mg  daily.  - Comprehensive metabolic panel  - amLODipine (NORVASC) 2.5 MG tablet; Take 1 tablet (2.5 mg total) by mouth daily.  Dispense: 30 tablet; Refill: 3  3. Hypothyroidism, postop - TSH Doing well current regiment of thyroid replacement.    Lelon Huh, MD  [facility]

## 2017-04-10 NOTE — Patient Instructions (Addendum)
Ms. Suzanne Morgan , Thank you for taking time to come for your Medicare Wellness Visit. I appreciate your ongoing commitment to your health goals. Please review the following plan we discussed and let me know if I can assist you in the future.   Screening recommendations/referrals: Colonoscopy: Up to date Mammogram: Up to date Bone Density: Up to date Recommended yearly ophthalmology/optometry visit for glaucoma screening and checkup Recommended yearly dental visit for hygiene and checkup  Vaccinations: Influenza vaccine: Up to date Pneumococcal vaccine: Up to date Tdap vaccine: Up to date Shingles vaccine: Pt declines today.     Advanced directives: Please bring a copy of your POA (Power of Attorney) and/or Living Will to your next appointment.   Conditions/risks identified: Recommend increasing water intake to 4 glasses a day.  Next appointment: 9:20 AM today with Dr Caryn Section.   Preventive Care 78 Years and Older, Female Preventive care refers to lifestyle choices and visits with your health care provider that can promote health and wellness. What does preventive care include?  A yearly physical exam. This is also called an annual well check.  Dental exams once or twice a year.  Routine eye exams. Ask your health care provider how often you should have your eyes checked.  Personal lifestyle choices, including:  Daily care of your teeth and gums.  Regular physical activity.  Eating a healthy diet.  Avoiding tobacco and drug use.  Limiting alcohol use.  Practicing safe sex.  Taking low-dose aspirin every day.  Taking vitamin and mineral supplements as recommended by your health care provider. What happens during an annual well check? The services and screenings done by your health care provider during your annual well check will depend on your age, overall health, lifestyle risk factors, and family history of disease. Counseling  Your health care provider may ask you  questions about your:  Alcohol use.  Tobacco use.  Drug use.  Emotional well-being.  Home and relationship well-being.  Sexual activity.  Eating habits.  History of falls.  Memory and ability to understand (cognition).  Work and work Statistician.  Reproductive health. Screening  You may have the following tests or measurements:  Height, weight, and BMI.  Blood pressure.  Lipid and cholesterol levels. These may be checked every 5 years, or more frequently if you are over 58 years old.  Skin check.  Lung cancer screening. You may have this screening every year starting at age 48 if you have a 30-pack-year history of smoking and currently smoke or have quit within the past 15 years.  Fecal occult blood test (FOBT) of the stool. You may have this test every year starting at age 30.  Flexible sigmoidoscopy or colonoscopy. You may have a sigmoidoscopy every 5 years or a colonoscopy every 10 years starting at age 71.  Hepatitis C blood test.  Hepatitis B blood test.  Sexually transmitted disease (STD) testing.  Diabetes screening. This is done by checking your blood sugar (glucose) after you have not eaten for a while (fasting). You may have this done every 1-3 years.  Bone density scan. This is done to screen for osteoporosis. You may have this done starting at age 24.  Mammogram. This may be done every 1-2 years. Talk to your health care provider about how often you should have regular mammograms. Talk with your health care provider about your test results, treatment options, and if necessary, the need for more tests. Vaccines  Your health care provider may recommend certain vaccines,  such as:  Influenza vaccine. This is recommended every year.  Tetanus, diphtheria, and acellular pertussis (Tdap, Td) vaccine. You may need a Td booster every 10 years.  Zoster vaccine. You may need this after age 66.  Pneumococcal 13-valent conjugate (PCV13) vaccine. One dose is  recommended after age 28.  Pneumococcal polysaccharide (PPSV23) vaccine. One dose is recommended after age 72. Talk to your health care provider about which screenings and vaccines you need and how often you need them. This information is not intended to replace advice given to you by your health care provider. Make sure you discuss any questions you have with your health care provider. Document Released: 02/06/2015 Document Revised: 09/30/2015 Document Reviewed: 11/11/2014 Elsevier Interactive Patient Education  2017 [place] Prevention in the Home Falls can cause injuries. They can happen to people of all ages. There are many things you can do to make your home safe and to help prevent falls. What can I do on the outside of my home?  Regularly fix the edges of walkways and driveways and fix any cracks.  Remove anything that might make you trip as you walk through a door, such as a raised step or threshold.  Trim any bushes or trees on the path to your home.  Use bright outdoor lighting.  Clear any walking paths of anything that might make someone trip, such as rocks or tools.  Regularly check to see if handrails are loose or broken. Make sure that both sides of any steps have handrails.  Any raised decks and porches should have guardrails on the edges.  Have any leaves, snow, or ice cleared regularly.  Use sand or salt on walking paths during winter.  Clean up any spills in your garage right away. This includes oil or grease spills. What can I do in the bathroom?  Use night lights.  Install grab bars by the toilet and in the tub and shower. Do not use towel bars as grab bars.  Use non-skid mats or decals in the tub or shower.  If you need to sit down in the shower, use a plastic, non-slip stool.  Keep the floor dry. Clean up any water that spills on the floor as soon as it happens.  Remove soap buildup in the tub or shower regularly.  Attach bath mats  securely with double-sided non-slip rug tape.  Do not have throw rugs and other things on the floor that can make you trip. What can I do in the bedroom?  Use night lights.  Make sure that you have a light by your bed that is easy to reach.  Do not use any sheets or blankets that are too big for your bed. They should not hang down onto the floor.  Have a firm chair that has side arms. You can use this for support while you get dressed.  Do not have throw rugs and other things on the floor that can make you trip. What can I do in the kitchen?  Clean up any spills right away.  Avoid walking on wet floors.  Keep items that you use a lot in easy-to-reach places.  If you need to reach something above you, use a strong step stool that has a grab bar.  Keep electrical cords out of the way.  Do not use floor polish or wax that makes floors slippery. If you must use wax, use non-skid floor wax.  Do not have throw rugs and other things on  the floor that can make you trip. What can I do with my stairs?  Do not leave any items on the stairs.  Make sure that there are handrails on both sides of the stairs and use them. Fix handrails that are broken or loose. Make sure that handrails are as long as the stairways.  Check any carpeting to make sure that it is firmly attached to the stairs. Fix any carpet that is loose or worn.  Avoid having throw rugs at the top or bottom of the stairs. If you do have throw rugs, attach them to the floor with carpet tape.  Make sure that you have a light switch at the top of the stairs and the bottom of the stairs. If you do not have them, ask someone to add them for you. What else can I do to help prevent falls?  Wear shoes that:  Do not have high heels.  Have rubber bottoms.  Are comfortable and fit you well.  Are closed at the toe. Do not wear sandals.  If you use a stepladder:  Make sure that it is fully opened. Do not climb a closed  stepladder.  Make sure that both sides of the stepladder are locked into place.  Ask someone to hold it for you, if possible.  Clearly mark and make sure that you can see:  Any grab bars or handrails.  First and last steps.  Where the edge of each step is.  Use tools that help you move around (mobility aids) if they are needed. These include:  Canes.  Walkers.  Scooters.  Crutches.  Turn on the lights when you go into a dark area. Replace any light bulbs as soon as they burn out.  Set up your furniture so you have a clear path. Avoid moving your furniture around.  If any of your floors are uneven, fix them.  If there are any pets around you, be aware of where they are.  Review your medicines with your doctor. Some medicines can make you feel dizzy. This can increase your chance of falling. Ask your doctor what other things that you can do to help prevent falls. This information is not intended to replace advice given to you by your health care provider. Make sure you discuss any questions you have with your health care provider. Document Released: 11/06/2008 Document Revised: 06/18/2015 Document Reviewed: 02/14/2014 Elsevier Interactive Patient Education  2017 [employer].

## 2017-04-10 NOTE — Patient Instructions (Addendum)
 The CDC recommends two doses of Shingrix (the shingles vaccine) separated by 2 to 6 months for adults age 78 years and older. I recommend checking with your insurance plan regarding coverage for this vaccine.     Preventive Care 65 Years and Older, Female Preventive care refers to lifestyle choices and visits with your health care provider that can promote health and wellness. What does preventive care include?  A yearly physical exam. This is also called an annual well check.  Dental exams once or twice a year.  Routine eye exams. Ask your health care provider how often you should have your eyes checked.  Personal lifestyle choices, including: ? Daily care of your teeth and gums. ? Regular physical activity. ? Eating a healthy diet. ? Avoiding tobacco and drug use. ? Limiting alcohol use. ? Practicing safe sex. ? Taking low-dose aspirin every day. ? Taking vitamin and mineral supplements as recommended by your health care provider. What happens during an annual well check? The services and screenings done by your health care provider during your annual well check will depend on your age, overall health, lifestyle risk factors, and family history of disease. Counseling Your health care provider may ask you questions about your:  Alcohol use.  Tobacco use.  Drug use.  Emotional well-being.  Home and relationship well-being.  Sexual activity.  Eating habits.  History of falls.  Memory and ability to understand (cognition).  Work and work environment.  Reproductive health.  Screening You may have the following tests or measurements:  Height, weight, and BMI.  Blood pressure.  Lipid and cholesterol levels. These may be checked every 5 years, or more frequently if you are over 78 years old.  Skin check.  Lung cancer screening. You may have this screening every year starting at age 55 if you have a 30-pack-year history of smoking and currently smoke or have  quit within the past 15 years.  Fecal occult blood test (FOBT) of the stool. You may have this test every year starting at age 78.  Flexible sigmoidoscopy or colonoscopy. You may have a sigmoidoscopy every 5 years or a colonoscopy every 10 years starting at age 78.  Hepatitis C blood test.  Hepatitis B blood test.  Sexually transmitted disease (STD) testing.  Diabetes screening. This is done by checking your blood sugar (glucose) after you have not eaten for a while (fasting). You may have this done every 1-3 years.  Bone density scan. This is done to screen for osteoporosis. You may have this done starting at age 65.  Mammogram. This may be done every 1-2 years. Talk to your health care provider about how often you should have regular mammograms.  Talk with your health care provider about your test results, treatment options, and if necessary, the need for more tests. Vaccines Your health care provider may recommend certain vaccines, such as:  Influenza vaccine. This is recommended every year.  Tetanus, diphtheria, and acellular pertussis (Tdap, Td) vaccine. You may need a Td booster every 10 years.  Varicella vaccine. You may need this if you have not been vaccinated.  Zoster vaccine. You may need this after age 60.  Measles, mumps, and rubella (MMR) vaccine. You may need at least one dose of MMR if you were born in 1957 or later. You may also need a second dose.  Pneumococcal 13-valent conjugate (PCV13) vaccine. One dose is recommended after age 65.  Pneumococcal polysaccharide (PPSV23) vaccine. One dose is recommended after age 65.    Meningococcal vaccine. You may need this if you have certain conditions.  Hepatitis A vaccine. You may need this if you have certain conditions or if you travel or work in places where you may be exposed to hepatitis A.  Hepatitis B vaccine. You may need this if you have certain conditions or if you travel or work in places where you may be  exposed to hepatitis B.  Haemophilus influenzae type b (Hib) vaccine. You may need this if you have certain conditions.  Talk to your health care provider about which screenings and vaccines you need and how often you need them. This information is not intended to replace advice given to you by your health care provider. Make sure you discuss any questions you have with your health care provider. Document Released: 02/06/2015 Document Revised: 09/30/2015 Document Reviewed: 11/11/2014 Elsevier Interactive Patient Education  [employer].

## 2017-04-11 LAB — COMPREHENSIVE METABOLIC PANEL
A/G RATIO: 1.8 (ref 1.2–2.2)
ALK PHOS: 76 IU/L (ref 39–117)
ALT: 20 IU/L (ref 0–32)
AST: 25 IU/L (ref 0–40)
Albumin: 4.8 g/dL (ref 3.5–4.8)
BILIRUBIN TOTAL: 0.6 mg/dL (ref 0.0–1.2)
BUN/Creatinine Ratio: 13 (ref 12–28)
BUN: 12 mg/dL (ref 8–27)
CHLORIDE: 102 mmol/L (ref 96–106)
CO2: 25 mmol/L (ref 20–29)
Calcium: 9.9 mg/dL (ref 8.7–10.3)
Creatinine, Ser: 0.92 mg/dL (ref 0.57–1.00)
GFR calc Af Amer: 69 mL/min/{1.73_m2} (ref >59)
GFR calc non Af Amer: 60 mL/min/{1.73_m2} (ref >59)
GLUCOSE: 90 mg/dL (ref 65–99)
Globulin, Total: 2.6 g/dL (ref 1.5–4.5)
POTASSIUM: 4.2 mmol/L (ref 3.5–5.2)
Sodium: 144 mmol/L (ref 134–144)
Total Protein: 7.4 g/dL (ref 6.0–8.5)

## 2017-04-11 LAB — TSH: TSH: 1.77 u[IU]/mL (ref 0.450–4.500)

## 2017-04-11 LAB — MEASLES/MUMPS/RUBELLA IMMUNITY
MUMPS ABS, IGG: 287 [AU]/ml (ref >10.9)
RUBEOLA AB, IGG: 250 [AU]/ml (ref >29.9)
Rubella Antibodies, IGG: <0.9 index — ABNORMAL LOW (ref >0.99)

## 2017-04-13 DIAGNOSIS — C44622 Squamous cell carcinoma of skin of right upper limb, including shoulder: Secondary | ICD-10-CM | POA: Diagnosis not present

## 2017-04-18 DIAGNOSIS — B999 Unspecified infectious disease: Secondary | ICD-10-CM | POA: Diagnosis not present

## 2017-04-20 DIAGNOSIS — L03119 Cellulitis of unspecified part of limb: Secondary | ICD-10-CM | POA: Diagnosis not present

## 2017-04-20 DIAGNOSIS — L03011 Cellulitis of right finger: Secondary | ICD-10-CM | POA: Diagnosis not present

## 2017-04-24 ENCOUNTER — Other Ambulatory Visit: Payer: Self-pay | Admitting: Family Medicine

## 2017-04-24 DIAGNOSIS — Z1231 Encounter for screening mammogram for malignant neoplasm of breast: Secondary | ICD-10-CM

## 2017-04-25 DIAGNOSIS — T8130XA Disruption of wound, unspecified, initial encounter: Secondary | ICD-10-CM | POA: Diagnosis not present

## 2017-04-27 NOTE — Telephone Encounter (Signed)
complete

## 2017-04-28 DIAGNOSIS — L905 Scar conditions and fibrosis of skin: Secondary | ICD-10-CM | POA: Diagnosis not present

## 2017-04-28 DIAGNOSIS — Z859 Personal history of malignant neoplasm, unspecified: Secondary | ICD-10-CM | POA: Diagnosis not present

## 2017-04-28 DIAGNOSIS — L578 Other skin changes due to chronic exposure to nonionizing radiation: Secondary | ICD-10-CM | POA: Diagnosis not present

## 2017-04-28 DIAGNOSIS — L821 Other seborrheic keratosis: Secondary | ICD-10-CM | POA: Diagnosis not present

## 2017-05-04 DIAGNOSIS — R69 Illness, unspecified: Secondary | ICD-10-CM | POA: Diagnosis not present

## 2017-05-19 ENCOUNTER — Ambulatory Visit (INDEPENDENT_AMBULATORY_CARE_PROVIDER_SITE_OTHER): Payer: Medicare HMO | Admitting: Family Medicine

## 2017-05-19 ENCOUNTER — Encounter: Payer: Self-pay | Admitting: Family Medicine

## 2017-05-19 VITALS — BP 130/80 | HR 101 | Temp 98.5°F | Resp 16 | Wt 106.4 lb

## 2017-05-19 DIAGNOSIS — R197 Diarrhea, unspecified: Secondary | ICD-10-CM

## 2017-05-19 LAB — HEMOCCULT GUIAC POC 1CARD (OFFICE): Fecal Occult Blood, POC: POSITIVE — AB

## 2017-05-19 NOTE — Patient Instructions (Signed)
After you submit the stool test may start probiotics. We will call you with the stool test results and discuss any further treatment.

## 2017-05-19 NOTE — Progress Notes (Signed)
Subjective:     Patient ID: Suzanne Morgan, female   DOB: 1940-02-01, 77 y.o.   MRN: [id number] Chief Complaint  Patient presents with  . Diarrhea    Patient comes in office today with complaints of diarrhea for the past 2 weeks. Patient reports two weeks ago she started having small dark stools, patient denies blood in stool or abdominal pain. In the past 3-4 days patient states that stools have become watery and has had mucous. Patient states that she started probiotic yesterday to see if it would help with symptom.    HPI States she completed a course of clindamycin on 4/3. States stools were initially dark then became watery and now "Slimey with mucus". Reports small amount of brown to yellow stool up to 10 x day but no cramping. Previous bowel pattern once daily. Colonoscopy in 2008 with hyperplastic polyp. States she feels well otherwise.  Review of Systems     Objective:   Physical Exam  Constitutional: She appears well-developed and well-nourished. No distress.  Abdominal: Soft. There is no tenderness.  Genitourinary: Rectal exam shows guaiac positive stool.       Assessment:    1. Diarrhea, unspecified type - C difficile Toxins A+B W/Rflx - POCT occult blood stool    Plan:    Will start probiotics after obtaining stool sample. Further f/u pending stool test results.

## 2017-05-20 ENCOUNTER — Telehealth: Payer: Self-pay | Admitting: Family Medicine

## 2017-05-20 NOTE — Telephone Encounter (Signed)
Pt called to see if her lab results from 05/19/17 were back and if so, could someone call her to give her the results. Please advise. Thanks TNP

## 2017-05-21 LAB — C DIFFICILE TOXINS A+B W/RFLX: C DIFFICILE TOXINS A+B, EIA: POSITIVE — AB

## 2017-05-22 ENCOUNTER — Other Ambulatory Visit: Payer: Self-pay | Admitting: Family Medicine

## 2017-05-22 MED ORDER — METRONIDAZOLE 500 MG PO TABS: 500.0000 mg | ORAL_TABLET | Freq: Three times a day (TID) | ORAL | 0 refills | Status: DC

## 2017-05-22 NOTE — Telephone Encounter (Signed)
PA, Robert(Bob) has already spoken and dicussed with patient

## 2017-05-29 ENCOUNTER — Encounter: Payer: Self-pay | Admitting: Family Medicine

## 2017-05-29 ENCOUNTER — Ambulatory Visit (INDEPENDENT_AMBULATORY_CARE_PROVIDER_SITE_OTHER): Payer: Medicare HMO | Admitting: Family Medicine

## 2017-05-29 VITALS — BP 140/80 | HR 100 | Temp 97.9°F | Resp 16 | Wt 106.8 lb

## 2017-05-29 DIAGNOSIS — A0472 Enterocolitis due to Clostridium difficile, not specified as recurrent: Secondary | ICD-10-CM

## 2017-05-29 NOTE — Progress Notes (Signed)
  Subjective:     Patient ID: Suzanne Morgan, female   DOB: 09/11/1940, 77 y.o.   MRN: [id number] Chief Complaint  Patient presents with  . Dizziness    Patient comes in office today with copmplaints of dizziness and feeling light headaed for the past 3 days, patient feels her medication is cause of symptoms   HPI States her bowels are improved with less frequency and starting to form. She has two days remaining on metronidazole for C.Diff colitis. There is a 4% reported frequency of dizziness with metronidazole.  Review of Systems     Objective:   Physical Exam  Constitutional: She appears well-developed and well-nourished. No distress.       Assessment:    1. Colitis due to Clostridium difficile: complete course of abx as she is tolerating mild lightheadedness.    Plan:    Phone f/u if not  resuming normal bowel pattern over the next week.

## 2017-05-29 NOTE — Patient Instructions (Signed)
Let me know if you don't get back to your normal bowel pattern or recurrence of frequent, watery,loose stools.

## 2017-06-02 ENCOUNTER — Telehealth: Payer: Self-pay | Admitting: Family Medicine

## 2017-06-02 ENCOUNTER — Ambulatory Visit
Admission: RE | Admit: 2017-06-02 | Discharge: 2017-06-02 | Disposition: A | Discharge status: Home / Self Care | Payer: Medicare HMO | Source: Ambulatory Visit | Attending: Family Medicine | Admitting: Family Medicine

## 2017-06-02 DIAGNOSIS — Z1231 Encounter for screening mammogram for malignant neoplasm of breast: Secondary | ICD-10-CM | POA: Diagnosis not present

## 2017-06-02 MED ORDER — VANCOMYCIN HCL 125 MG PO CAPS: 125.0000 mg | ORAL_CAPSULE | Freq: Four times a day (QID) | ORAL | 0 refills | Status: DC

## 2017-06-02 NOTE — Telephone Encounter (Signed)
Change to vancomycin 125mg  Q6 hours x 10 days. Follow up o.v. 10 days.

## 2017-06-02 NOTE — Telephone Encounter (Signed)
Please advise 

## 2017-06-02 NOTE — Telephone Encounter (Signed)
Pt states she has been treated for c-diff and finished her antibiotic and has not improved much.  She is wanting to know what her options are at this point.

## 2017-06-02 NOTE — Telephone Encounter (Signed)
Patient was advised. Expressed understanding. Rx sent to pharmacy.

## 2017-06-06 ENCOUNTER — Encounter: Payer: Self-pay | Admitting: Family Medicine

## 2017-06-06 ENCOUNTER — Ambulatory Visit (INDEPENDENT_AMBULATORY_CARE_PROVIDER_SITE_OTHER): Payer: Medicare HMO | Admitting: Family Medicine

## 2017-06-06 VITALS — BP 120/74 | HR 103 | Temp 97.8°F | Resp 16 | Wt 103.0 lb

## 2017-06-06 DIAGNOSIS — R197 Diarrhea, unspecified: Secondary | ICD-10-CM | POA: Diagnosis not present

## 2017-06-06 DIAGNOSIS — A0472 Enterocolitis due to Clostridium difficile, not specified as recurrent: Secondary | ICD-10-CM

## 2017-06-06 NOTE — Progress Notes (Signed)
Patient: Suzanne Morgan Female    DOB: 1940/05/15   77 y.o.   MRN: [id number] Visit Date: 06/06/2017  Today's Provider: Lelon Huh, MD   No chief complaint on file.  Subjective:    HPI  Patient states she was prescribed Flagyl for c-diff 04/29/2017. Since taking Flagyl her mouth has felt weird. Patient stated her uvula had a sore on it which is no gone. She finished metronidazole last week and was changed to vancomycin on 06/02/2017 due to persistent diarrhea. Since then frequency of BM has decreased and stool are no longer water, but not yet back to normal. She states that since stopping metronidazole that oral symptoms have improved and nearly back to normal.   Allergies  Allergen Reactions  . Sulfa Antibiotics   . Other Rash    KAPIDEX      Current Outpatient Medications:  .  amLODipine (NORVASC) 2.5 MG tablet, Take 1 tablet (2.5 mg total) by mouth daily., Disp: 30 tablet, Rfl: 3 .  calcium carbonate (OS-CAL) 600 MG TABS tablet, Take 600 mg by mouth daily with breakfast. , Disp: , Rfl:  .  levothyroxine (SYNTHROID, LEVOTHROID) 100 MCG tablet, Take 1 tablet (100 mcg total) by mouth daily., Disp: 90 tablet, Rfl: 4 .  Multiple Vitamin tablet, Take 1 tablet by mouth daily. , Disp: , Rfl:  .  saccharomyces boulardii (FLORASTOR) 250 MG capsule, Take 250 mg by mouth 2 (two) times daily., Disp: , Rfl:  .  vancomycin (VANCOCIN HCL) 125 MG capsule, Take 1 capsule (125 mg total) by mouth every 6 (six) hours., Disp: 40 capsule, Rfl: 0 .  metroNIDAZOLE (FLAGYL) 500 MG tablet, Take 1 tablet (500 mg total) by mouth 3 (three) times daily. (Patient not taking: Reported on 06/06/2017), Disp: 30 tablet, Rfl: 0  Review of Systems  Constitutional: Negative for appetite change, chills, fatigue and fever.  Respiratory: Negative for chest tightness and shortness of breath.   Cardiovascular: Negative for chest pain and palpitations.  Gastrointestinal: Negative for abdominal pain, nausea and  vomiting.  Neurological: Negative for dizziness and weakness.    Social History   Tobacco Use  . Smoking status: Never Smoker  . Smokeless tobacco: Never Used  Substance Use Topics  . Alcohol use: Yes    Alcohol/week: 0.0 - 0.6 oz   Objective:   BP 120/74 (BP Location: Right Arm, Patient Position: Sitting, Cuff Size: Normal)   Pulse (!) 103   Temp 97.8 F (36.6 C) (Oral)   Resp 16   Wt 103 lb (46.7 kg)   SpO2 98%   BMI 20.12 kg/m  Vitals:   06/06/17 0823  BP: 120/74  Pulse: (!) 103  Resp: 16  Temp: 97.8 F (36.6 C)  TempSrc: Oral  SpO2: 98%  Weight: 103 lb (46.7 kg)     Physical Exam  General Appearance:    Alert, cooperative, no distress  HENT:   ENT exam normal, no neck nodes or sinus tenderness. Normal oral pharynx tongue, uvula, and palate.           Assessment & Plan:     1. Colitis due to Clostridium difficile   2. Diarrhea, unspecified type  Tolerating vancomycin better than metronidazole. Counseled on potential for thrush and other yeast infection after prolonged course of antibiotic. Advised to call if any such symptoms develops. Otherwise colitis sx steadily improving on vancomycin. Lelon Huh, MD  [facility]

## 2017-06-13 ENCOUNTER — Encounter: Payer: Self-pay | Admitting: Family Medicine

## 2017-06-13 ENCOUNTER — Ambulatory Visit (INDEPENDENT_AMBULATORY_CARE_PROVIDER_SITE_OTHER): Payer: Medicare HMO | Admitting: Family Medicine

## 2017-06-13 VITALS — BP 120/62 | HR 81 | Temp 97.9°F | Resp 16 | Wt 104.0 lb

## 2017-06-13 DIAGNOSIS — I10 Essential (primary) hypertension: Secondary | ICD-10-CM | POA: Diagnosis not present

## 2017-06-13 DIAGNOSIS — Z8619 Personal history of other infectious and parasitic diseases: Secondary | ICD-10-CM | POA: Diagnosis not present

## 2017-06-13 NOTE — Progress Notes (Signed)
Patient: Suzanne Morgan Female    DOB: 18-Sep-1940   77 y.o.   MRN: [id number] Visit Date: 06/13/2017  Today's Provider: Lelon Huh, MD   Chief Complaint  Patient presents with  . Follow-up   Subjective:    HPI  Colitis due to Clostridium difficile From 06/06/2016-continue course of vancomycin. Today patient comes in reporting that she has completed all doses of the antibiotic. Her last dose was yesterday. She states she feels better and her symptoms have resolved.   Also to follow up for hypertension since starting amlodipine in March. She states she is tolerating medication well. Checks BP at home about once a week and stays consistently in the 120s to 130s. No chest pains or dyspnea.  BP Readings from Last 5 Encounters:  06/13/17 120/62  06/06/17 120/74  05/29/17 140/80  05/19/17 130/80  04/10/17 (!) 156/84      Allergies  Allergen Reactions  . Sulfa Antibiotics   . Other Rash    KAPIDEX      Current Outpatient Medications:  .  amLODipine (NORVASC) 2.5 MG tablet, Take 1 tablet (2.5 mg total) by mouth daily., Disp: 30 tablet, Rfl: 3 .  calcium carbonate (OS-CAL) 600 MG TABS tablet, Take 600 mg by mouth daily with breakfast. , Disp: , Rfl:  .  LACTOBACILLUS BIFIDUS PO, Take 1 tablet by mouth daily., Disp: , Rfl:  .  levothyroxine (SYNTHROID, LEVOTHROID) 100 MCG tablet, Take 1 tablet (100 mcg total) by mouth daily., Disp: 90 tablet, Rfl: 4 .  Multiple Vitamin tablet, Take 1 tablet by mouth daily. , Disp: , Rfl:  .  saccharomyces boulardii (FLORASTOR) 250 MG capsule, Take 250 mg by mouth 2 (two) times daily., Disp: , Rfl:  .  vancomycin (VANCOCIN HCL) 125 MG capsule, Take 1 capsule (125 mg total) by mouth every 6 (six) hours. (Patient not taking: Reported on 06/13/2017), Disp: 40 capsule, Rfl: 0  Review of Systems  Constitutional: Negative for appetite change, chills, fatigue and fever.  Respiratory: Negative for chest tightness and shortness of breath.     Cardiovascular: Negative for chest pain and palpitations.  Gastrointestinal: Negative for abdominal pain, nausea and vomiting.  Neurological: Negative for dizziness and weakness.    Social History   Tobacco Use  . Smoking status: Never Smoker  . Smokeless tobacco: Never Used  Substance Use Topics  . Alcohol use: Yes    Alcohol/week: 0.0 - 0.6 oz   Objective:   BP 120/62 (BP Location: Left Arm, Patient Position: Sitting, Cuff Size: Normal)   Pulse 81   Temp 97.9 F (36.6 C) (Oral)   Resp 16   Wt 104 lb (47.2 kg)   SpO2 99% Comment: room air  BMI 20.31 kg/m     Physical Exam   General Appearance:    Alert, cooperative, no distress  Eyes:    PERRL, conjunctiva/corneas clear, EOM's intact       Lungs:     Clear to auscultation bilaterally, respirations unlabored  Heart:    Regular rate and rhythm  Neurologic:   Awake, alert, oriented x 3. No apparent focal neurological           defect.           Assessment & Plan:      1. History of Clostridium difficile colitis Counseled on s/s of recurrent infection and associated of c. Diff with antibiotic use. Consider probiotics whenever she needs antibiotic in the future. Return if any  s/s of recurrence of c. Diff.   2. Essential hypertension Much better with addition of amlodipine. Tolerated will, Continue current medications.         Lelon Huh, MD  [facility]

## 2017-07-02 ENCOUNTER — Other Ambulatory Visit: Payer: Self-pay | Admitting: Family Medicine

## 2017-07-02 DIAGNOSIS — I10 Essential (primary) hypertension: Secondary | ICD-10-CM

## 2017-07-03 ENCOUNTER — Ambulatory Visit (INDEPENDENT_AMBULATORY_CARE_PROVIDER_SITE_OTHER): Payer: Medicare HMO | Admitting: Family Medicine

## 2017-07-03 ENCOUNTER — Encounter: Payer: Self-pay | Admitting: Family Medicine

## 2017-07-03 VITALS — BP 130/64 | HR 98 | Temp 98.3°F | Resp 16 | Wt 102.0 lb

## 2017-07-03 DIAGNOSIS — R194 Change in bowel habit: Secondary | ICD-10-CM

## 2017-07-03 DIAGNOSIS — R195 Other fecal abnormalities: Secondary | ICD-10-CM

## 2017-07-03 LAB — HEMOCCULT GUIAC POC 1CARD (OFFICE): Fecal Occult Blood, POC: NEGATIVE

## 2017-07-03 NOTE — Progress Notes (Signed)
       Patient: Suzanne Morgan Female    DOB: 1940/07/18   77 y.o.   MRN: [id number] Visit Date: 07/03/2017  Today's Provider: Lelon Huh, MD   Chief Complaint  Patient presents with  . Follow-up   Subjective:    HPI Rectal Lump: Patient comes in today reporting that she has sensation of a lump on her rectum since last week ago. She denies any rectal pain or bleeding. She says the lump is noticeable when she is sitting down, although she has not physical felt a lump with her hands. Her stools have become pencil thin. No blood in stool. No change in stool color.     Allergies  Allergen Reactions  . Sulfa Antibiotics   . Other Rash    KAPIDEX      Current Outpatient Medications:  .  amLODipine (NORVASC) 2.5 MG tablet, TAKE 1 TABLET BY MOUTH EVERY DAY, Disp: 30 tablet, Rfl: 11 .  calcium carbonate (OS-CAL) 600 MG TABS tablet, Take 600 mg by mouth daily with breakfast. , Disp: , Rfl:  .  LACTOBACILLUS BIFIDUS PO, Take 1 tablet by mouth daily., Disp: , Rfl:  .  levothyroxine (SYNTHROID, LEVOTHROID) 100 MCG tablet, Take 1 tablet (100 mcg total) by mouth daily., Disp: 90 tablet, Rfl: 4 .  Multiple Vitamin tablet, Take 1 tablet by mouth daily. , Disp: , Rfl:  .  saccharomyces boulardii (FLORASTOR) 250 MG capsule, Take 250 mg by mouth 2 (two) times daily., Disp: , Rfl:   Review of Systems  Constitutional: Negative for appetite change, chills, fatigue and fever.  Respiratory: Negative for chest tightness and shortness of breath.   Cardiovascular: Negative for chest pain and palpitations.  Gastrointestinal: Negative for abdominal pain, nausea and vomiting.       Rectal lump  Neurological: Negative for dizziness and weakness.    Social History   Tobacco Use  . Smoking status: Never Smoker  . Smokeless tobacco: Never Used  Substance Use Topics  . Alcohol use: Yes    Alcohol/week: 0.0 - 0.6 oz   Objective:   BP 130/64 (BP Location: Left Arm, Patient Position: Sitting, Cuff  Size: Normal)   Pulse 98   Temp 98.3 F (36.8 C) (Oral)   Resp 16   Wt 102 lb (46.3 kg)   SpO2 99% Comment: room air  BMI 19.92 kg/m  Vitals:   07/03/17 1012  BP: 130/64  Pulse: 98  Resp: 16  Temp: 98.3 F (36.8 C)  TempSrc: Oral  SpO2: 99%  Weight: 102 lb (46.3 kg)     Physical Exam  Rectal: Normal rectal tone. No anal or rectal masses. Very small amount of soft brown stool  Hemoccult: Negative.      Assessment & Plan:     1. Change in stool caliber Normal external and internal rectal exam. Stools had been normal prior to last week after being treated for c. Diff. If tools not returning to normal within two weeks will refer to GI for colonoscopy as her last one was in 2008.  - POCT occult blood stool       Lelon Huh, MD  [facility]

## 2017-07-05 ENCOUNTER — Ambulatory Visit: Payer: Self-pay | Admitting: Family Medicine

## 2017-07-19 ENCOUNTER — Telehealth: Payer: Self-pay | Admitting: *Deleted

## 2017-07-19 DIAGNOSIS — R195 Other fecal abnormalities: Secondary | ICD-10-CM

## 2017-07-19 NOTE — Telephone Encounter (Signed)
Patient called office concerning rectal discomfort. Patient was seen in office 07/03/2017. Patient states she would to see GI if this is possible. Patient states she saw Dr. Allen Norris years ago. Please advise?

## 2017-07-20 NOTE — Telephone Encounter (Signed)
Please advise 

## 2017-07-20 NOTE — Telephone Encounter (Signed)
Pt called to follow up on this.  Please advised.   Contact number: [phone]    Thanks,   -Mickel Baas

## 2017-07-20 NOTE — Telephone Encounter (Signed)
Order was placed for referral. She should hear back from referral coordinator in the next few days.

## 2017-07-21 NOTE — Telephone Encounter (Signed)
Patient was advised.  

## 2017-08-01 ENCOUNTER — Ambulatory Visit (INDEPENDENT_AMBULATORY_CARE_PROVIDER_SITE_OTHER): Payer: Medicare HMO | Admitting: Gastroenterology

## 2017-08-01 ENCOUNTER — Encounter: Payer: Self-pay | Admitting: Gastroenterology

## 2017-08-01 VITALS — BP 146/74 | HR 89 | Ht 61.0 in | Wt 104.0 lb

## 2017-08-01 DIAGNOSIS — A0472 Enterocolitis due to Clostridium difficile, not specified as recurrent: Secondary | ICD-10-CM | POA: Diagnosis not present

## 2017-08-01 DIAGNOSIS — R195 Other fecal abnormalities: Secondary | ICD-10-CM

## 2017-08-01 NOTE — Progress Notes (Signed)
[name] [address]  [place]  [place], [place] [address]  Main: [phone]  Fax: [phone]   Gastroenterology Consultation  Referring Provider:     Birdie Sons, MD Primary Care Physician:  Birdie Sons, MD Primary Gastroenterologist:  Dr. [name] Reason for Consultation:     Altered stool consistency        HPI:   Chief complaint: Stool consistency   Suzanne Morgan is a 77 y.o. y/o female referred for consultation & management  by Dr. Birdie Sons, MD.  Patient had positive C. difficile in April 2019, after taking clindamycin and has undergone treatment with vancomycin prescribed by her primary care provider.  Her symptoms of C. difficile consisted of thin, "small snake like" loose stools.  No fever chills, abdominal pain, nausea vomiting, blood in stool.  Since her treatment, now she has 2-3 formed bowel movements daily.  Her norm prior to the C. difficile was 1 formed bowel daily.  No loose or watery stools.  No blood in stool.  She also reports "feeling something" in her perianal area, and thinks that it is her tailbone, and would like me to evaluate that.  She had a colonoscopy for screening 2008 by Dr. Allen Norris.  Sigmoid colon polyp was removed, and as per an [facility] colonoscopy encounter in her chart, it was a hyperplastic polyp.  No family history of colon cancer  Past Medical History:  Diagnosis Date  . Herpes zoster without complication 76/16/0737  . History of chicken pox   . History of mumps   . Squamous cell carcinoma     Past Surgical History:  Procedure Laterality Date  . BREAST CYST ASPIRATION Right many yrs ago   negative  . THYROIDECTOMY    . UPPER GI ENDOSCOPY  06/13/07   HIATAL HERNIA, IRREGEULAR Z-LINE    Prior to Admission medications   Medication Sig Start Date End Date Taking? Authorizing Provider  amLODipine (NORVASC) 2.5 MG tablet TAKE 1 TABLET BY MOUTH EVERY DAY 07/02/17   Birdie Sons, MD  calcium  carbonate (OS-CAL) 600 MG TABS tablet Take 600 mg by mouth daily with breakfast.  12/26/06   [provider]  LACTOBACILLUS BIFIDUS PO Take 1 tablet by mouth daily.    [provider]  levothyroxine (SYNTHROID, LEVOTHROID) 100 MCG tablet Take 1 tablet (100 mcg total) by mouth daily. 01/04/17   Birdie Sons, MD  Multiple Vitamin tablet Take 1 tablet by mouth daily.  12/13/05   [provider]  saccharomyces boulardii (FLORASTOR) 250 MG capsule Take 250 mg by mouth 2 (two) times daily.    [provider]    Family History  Problem Relation Age of Onset  . Stroke Mother   . Heart attack Father      Social History   Tobacco Use  . Smoking status: Never Smoker  . Smokeless tobacco: Never Used  Substance Use Topics  . Alcohol use: Yes    Alcohol/week: 0.0 - 0.6 oz  . Drug use: No    Allergies as of 08/01/2017 - Review Complete 07/03/2017  Allergen Reaction Noted  . Sulfa antibiotics  03/05/2015  . Other Rash 03/05/2015    Review of Systems:    All systems reviewed and negative except where noted in HPI.   Physical Exam:  There were no vitals taken for this visit. No LMP recorded. Patient is postmenopausal. Psych:  Alert and cooperative. Normal mood and affect. General:  Alert,  Well-developed, well-nourished, pleasant and cooperative in NAD Head:  Normocephalic and atraumatic. Eyes:  Sclera clear, no icterus.   Conjunctiva pink. Ears:  Normal auditory acuity. Nose:  No deformity, discharge, or lesions. Mouth:  No deformity or lesions,oropharynx pink & moist. Neck:  Supple; no masses or thyromegaly. Abdomen:  Normal bowel sounds.  No bruits.  Soft, non-tender and non-distended without masses, hepatosplenomegaly or hernias noted.  No guarding or rebound tenderness.    Rectal: No perianal lesions, no external hemorrhoids, no tears or fissures, digital rectal exam did not reveal any masses in the rectal vault.  Coccyx palpated at the site  patient reports "feeling something"  Msk:  Symmetrical without gross deformities. Good, equal movement & strength bilaterally. Pulses:  Normal pulses noted. Extremities:  No clubbing or edema.  No cyanosis. Neurologic:  Alert and oriented x3;  grossly normal neurologically. Skin:  Intact without significant lesions or rashes. No jaundice. Lymph Nodes:  No significant cervical adenopathy. Psych:  Alert and cooperative. Normal mood and affect.   Labs: CBC No results found for: WBC, RBC, HGB, HCT, PLT, MCV, MCH, MCHC, RDW, LYMPHSABS, MONOABS, EOSABS, BASOSABS CMP     Component Value Date/Time   NA 144 04/10/2017 1006   K 4.2 04/10/2017 1006   CL 102 04/10/2017 1006   CO2 25 04/10/2017 1006   GLUCOSE 90 04/10/2017 1006   BUN 12 04/10/2017 1006   CREATININE 0.92 04/10/2017 1006   CALCIUM 9.9 04/10/2017 1006   PROT 7.4 04/10/2017 1006   ALBUMIN 4.8 04/10/2017 1006   AST 25 04/10/2017 1006   ALT 20 04/10/2017 1006   ALKPHOS 76 04/10/2017 1006   BILITOT 0.6 04/10/2017 1006   GFRNONAA 60 04/10/2017 1006   GFRAA 69 04/10/2017 1006    Imaging Studies: No results found.  Assessment and Plan:   Suzanne Morgan is a 77 y.o. y/o female has been referred for change in stool consistently, with a recent history of C. difficile in April 2019  Patient reports change in stool consistently since she has had C. difficile, but no further loose stools at this time Patient reports anywhere from 2-3 formed bowel movements daily at this time.  She does have days where she only has one formed bowel movement a day.  This is likely as her bowel movements are taking some time getting back to her normal regimen. Prior to this when she had the C. difficile, she was having more frequent bowel movements daily She is inquiring, if she needs a colonoscopy since she recently had C. difficile Symptoms of C. difficile have resolved, therefore no indication for colonoscopy for the C. difficile itself Continue  high-fiber diet If alarm symptoms occur, or stools become loose, she was asked to contact us.  I have recommended that she avoid antibiotics if possible, and if they are prescribed, to let the prescribing physician know that she has had a history of C. Diff so they can prescribe narrow spectrum antibiotics instead of broad-spectrum if possible.  No alarming findings felt was seen on rectal exam today She reported feeling her "tailbone" more prominently recently, and this is likely due to her having more frequent bowel movements when she has a C. difficile. I have recommended that she try to use squatty potty or an alternative stool to help relieve pressure when she is going to the bathroom   I did note, that she had an occult blood test done in April 2019.  Point-of-care testing was positive at  the time.  Patient states it was done at the time of procedure, before she was treated, and it was repeated by her primary care provider after this and it was negative 4 weeks ago.  We extensively discussed, that the positive fecal occult blood test usually indicates a colonoscopy to rule out underlying malignancy.  However, it can be false positive, or could have been positive due to her underlying C. difficile infection at the time.  Even though the second test was negative, we discussed that it cannot be definitively said that she does not have a colon malignancy until colonoscopies done.  Risks and benefits of the procedure were discussed in detail.  She chooses not to undergo colonoscopy at this time.  She states that if she changes her mind she will give [place] a call.  Therefore, she does not need a colonoscopy due to her recent C. difficile, but the colonoscopy would be for the positive fecal occult blood test in April 2019.  If her diarrhea recurs, this would need to be reevaluated, and management can be changed accordingly at that time.  She verbalized understanding of this, and does not want to proceed  with scheduling colonoscopy.     Dr [name]

## 2017-09-19 ENCOUNTER — Encounter: Payer: Self-pay | Admitting: Gastroenterology

## 2017-09-19 ENCOUNTER — Ambulatory Visit: Payer: Medicare HMO | Admitting: Gastroenterology

## 2017-09-19 VITALS — BP 139/79 | HR 91 | Ht 61.0 in | Wt 104.6 lb

## 2017-09-19 DIAGNOSIS — K5900 Constipation, unspecified: Secondary | ICD-10-CM

## 2017-09-19 NOTE — Patient Instructions (Signed)
Miralax daily F/U in 1 month  High-Fiber Diet Fiber, also called dietary fiber, is a type of carbohydrate found in fruits, vegetables, whole grains, and beans. A high-fiber diet can have many health benefits. Your health care provider may recommend a high-fiber diet to help:  Prevent constipation. Fiber can make your bowel movements more regular.  Lower your cholesterol.  Relieve hemorrhoids, uncomplicated diverticulosis, or irritable bowel syndrome.  Prevent overeating as part of a weight-loss plan.  Prevent heart disease, type 2 diabetes, and certain cancers.  What is my plan? The recommended daily intake of fiber includes:  38 grams for men under age 22.  34 grams for men over age 60.  20 grams for women under age 72.  3 grams for women over age 63.  You can get the recommended daily intake of dietary fiber by eating a variety of fruits, vegetables, grains, and beans. Your health care provider may also recommend a fiber supplement if it is not possible to get enough fiber through your diet. What do I need to know about a high-fiber diet?  Fiber supplements have not been widely studied for their effectiveness, so it is better to get fiber through food sources.  Always check the fiber content on thenutrition facts label of any prepackaged food. Look for foods that contain at least 5 grams of fiber per serving.  Ask your dietitian if you have questions about specific foods that are related to your condition, especially if those foods are not listed in the following section.  Increase your daily fiber consumption gradually. Increasing your intake of dietary fiber too quickly may cause bloating, cramping, or gas.  Drink plenty of water. Water helps you to digest fiber. What foods can I eat? Grains Whole-grain breads. Multigrain cereal. Oats and oatmeal. Brown rice. Barley. Bulgur wheat. [place]. Bran muffins. Popcorn. Rye wafer crackers. Vegetables Sweet potatoes. Spinach.  Kale. Artichokes. Cabbage. Broccoli. Green peas. Carrots. Squash. Fruits Berries. Pears. Apples. Oranges. Avocados. Prunes and raisins. Dried figs. Meats and Other Protein Sources Navy, kidney, pinto, and soy beans. Split peas. Lentils. Nuts and seeds. Dairy Fiber-fortified yogurt. Beverages Fiber-fortified soy milk. Fiber-fortified orange juice. Other Fiber bars. The items listed above may not be a complete list of recommended foods or beverages. Contact your dietitian for more options. What foods are not recommended? Grains White bread. Pasta made with refined flour. White rice. Vegetables Fried potatoes. Canned vegetables. Well-cooked vegetables. Fruits Fruit juice. Cooked, strained fruit. Meats and Other Protein Sources Fatty cuts of meat. Fried Sales executive or fried fish. Dairy Milk. Yogurt. Cream cheese. Sour cream. Beverages Soft drinks. Other Cakes and pastries. Butter and oils. The items listed above may not be a complete list of foods and beverages to avoid. Contact your dietitian for more information. What are some tips for including high-fiber foods in my diet?  Eat a wide variety of high-fiber foods.  Make sure that half of all grains consumed each day are whole grains.  Replace breads and cereals made from refined flour or white flour with whole-grain breads and cereals.  Replace white rice with brown rice, bulgur wheat, or millet.  Start the day with a breakfast that is high in fiber, such as a cereal that contains at least 5 grams of fiber per serving.  Use beans in place of meat in soups, salads, or pasta.  Eat high-fiber snacks, such as berries, raw vegetables, nuts, or popcorn. This information is not intended to replace advice given to you by your health  care provider. Make sure you discuss any questions you have with your health care provider. Document Released: 01/10/2005 Document Revised: 06/18/2015 Document Reviewed: 06/25/2013 Elsevier Interactive  Patient Education  [employer].

## 2017-09-20 NOTE — Progress Notes (Signed)
[name], MD [address]  [place]  [place], [place] [address]  Main: [phone]  Fax: [phone]   Primary Care Physician: Birdie Sons, MD  Primary Gastroenterologist:  Dr. [name]  Chief Complaint  Patient presents with  . Follow-up    CHANGE IN BOWEL HABITS, HX C-DIFF    HPI: Suzanne Morgan is a 77 y.o. female with history of C. difficile here for follow-up.  Her main concern is that she continues to feel a lump, or "feels something" in her rectum or her perianal area.  This was discussed on last visit, and perianal and digital rectal exam did not reveal any concerning findings.  Her bowel movements are back to normal, she reports formed stools without blood.  However, she does report the feeling of incomplete evacuation after having a bowel movement.  No abdominal pain, weight loss, nausea vomiting, blood in stool.  Previous history: Patient had positive C. difficile in April 2019, after taking clindamycin and has undergone treatment with vancomycin prescribed by her primary care provider.  Her symptoms of C. difficile consisted of thin, "small snake like" loose stools.   She had a colonoscopy for screening 2008 by Dr. Allen Norris.  Sigmoid colon polyp was removed, and as per an [facility] colonoscopy encounter in her chart, it was a hyperplastic polyp.  No family history of colon cancer  Current Outpatient Medications  Medication Sig Dispense Refill  . amLODipine (NORVASC) 2.5 MG tablet TAKE 1 TABLET BY MOUTH EVERY DAY 30 tablet 11  . calcium carbonate (OS-CAL) 600 MG TABS tablet Take 600 mg by mouth daily with breakfast.     . levothyroxine (SYNTHROID, LEVOTHROID) 100 MCG tablet Take 1 tablet (100 mcg total) by mouth daily. 90 tablet 4  . Multiple Vitamin tablet Take 1 tablet by mouth daily.     . polyethylene glycol (MIRALAX / GLYCOLAX) packet Take 17 g by mouth daily.    Marland Kitchen LACTOBACILLUS BIFIDUS PO Take 1 tablet by mouth daily.    Marland Kitchen saccharomyces  boulardii (FLORASTOR) 250 MG capsule Take 250 mg by mouth 2 (two) times daily.     No current facility-administered medications for this visit.     Allergies as of 09/19/2017 - Review Complete 09/19/2017  Allergen Reaction Noted  . Clindamycin/lincomycin Diarrhea 09/19/2017  . Sulfa antibiotics  03/05/2015  . Other Rash 03/05/2015    ROS:  General: Negative for anorexia, weight loss, fever, chills, fatigue, weakness. ENT: Negative for hoarseness, difficulty swallowing , nasal congestion. CV: Negative for chest pain, angina, palpitations, dyspnea on exertion, peripheral edema.  Respiratory: Negative for dyspnea at rest, dyspnea on exertion, cough, sputum, wheezing.  GI: See history of present illness. GU:  Negative for dysuria, hematuria, urinary incontinence, urinary frequency, nocturnal urination.  Endo: Negative for unusual weight change.    Physical Examination:   BP 139/79   Pulse 91   Ht 5\' 1"  (1.549 m)   Wt 104 lb 9.6 oz (47.4 kg)   BMI 19.76 kg/m   General: Well-nourished, well-developed in no acute distress.  Eyes: No icterus. Conjunctivae pink. Mouth: Oropharyngeal mucosa moist and pink , no lesions erythema or exudate. Neck: Supple, Trachea midline Abdomen: Bowel sounds are normal, nontender, nondistended, no hepatosplenomegaly or masses, no abdominal bruits or hernia , no rebound or guarding.   Rectal exam: No perianal lesions, no perianal tenderness, digital rectal exam with solid stool in the rectum, no masses in the rectal vault Extremities: No lower extremity edema. No clubbing  or deformities. Neuro: Alert and oriented x 3.  Grossly intact. Skin: Warm and dry, no jaundice.   Psych: Alert and cooperative, normal mood and affect.   Labs: CMP     Component Value Date/Time   NA 144 04/10/2017 1006   K 4.2 04/10/2017 1006   CL 102 04/10/2017 1006   CO2 25 04/10/2017 1006   GLUCOSE 90 04/10/2017 1006   BUN 12 04/10/2017 1006   CREATININE 0.92 04/10/2017  1006   CALCIUM 9.9 04/10/2017 1006   PROT 7.4 04/10/2017 1006   ALBUMIN 4.8 04/10/2017 1006   AST 25 04/10/2017 1006   ALT 20 04/10/2017 1006   ALKPHOS 76 04/10/2017 1006   BILITOT 0.6 04/10/2017 1006   GFRNONAA 60 04/10/2017 1006   GFRAA 69 04/10/2017 1006   No results found for: WBC, HGB, HCT, MCV, PLT  Imaging Studies: No results found.  Assessment and Plan:   Suzanne Morgan is a 77 y.o. y/o female with history of C. difficile here for follow-up  Due to patient's complaint of incomplete evacuation that only started recently, and her significant concern over this, despite reassuring lab findings, and normal rectal exam, we discussed colonoscopy for endoscopic visualization of the area versus trying MiraLAX to allow her to have more complete bowel movements, to see if it helps her symptoms.  She would like to proceed with MiraLAX daily first, and if this does not resolve her symptoms, she states she will call us in 2 weeks to let us know and then proceed with colonoscopy scheduling.  High-fiber diet MiraLAX daily with goal of 1-2 soft bowel movements daily.  If not at goal, patient instructed to increase dose to twice daily.  If loose stools with the medication, patient asked to decrease the medication to every other day, or half dose daily.  Patient verbalized understanding  No alarm symptoms present otherwise  Dr [name]

## 2017-10-05 DIAGNOSIS — H2513 Age-related nuclear cataract, bilateral: Secondary | ICD-10-CM | POA: Diagnosis not present

## 2017-10-19 DIAGNOSIS — R69 Illness, unspecified: Secondary | ICD-10-CM | POA: Diagnosis not present

## 2017-11-07 ENCOUNTER — Other Ambulatory Visit: Payer: Self-pay

## 2017-11-07 ENCOUNTER — Ambulatory Visit: Payer: Medicare HMO | Admitting: Gastroenterology

## 2017-11-07 ENCOUNTER — Encounter: Payer: Self-pay | Admitting: Gastroenterology

## 2017-11-07 VITALS — BP 136/75 | HR 84 | Ht 61.0 in | Wt 104.2 lb

## 2017-11-07 DIAGNOSIS — R198 Other specified symptoms and signs involving the digestive system and abdomen: Secondary | ICD-10-CM

## 2017-11-07 NOTE — Progress Notes (Signed)
[name], MD [address]  [place]  [place], [place] [address]  Main: [phone]  Fax: [phone]   Primary Care Physician: Birdie Sons, MD  Primary Gastroenterologist:  Dr. [name]  Chief Complaint  Patient presents with  . Follow-up    hx c-diff    HPI: Suzanne Morgan is a 77 y.o. female here for follow-up due to pressure-like sensation in her rectum/perianal area.  This has been the main source of her concern in her previous visits.  She has refused colonoscopy before.  She wants to try MiraLAX on last visit to see if it helps with the sensation, and she took it for 3 to 4 days and it caused loose stools and she stopped the medication.  She does not remember if it helped with the sensation at the time.  No constipation.  States has a bowel movement without straining.  No melena or hematochezia.  No weight loss.  Previous history: Previous rectal exam has shown no perianal lesions, no external hemorrhoids, no tears or fissures, digital rectal exam did not reveal any masses in the rectal vault.  Coccyx was palpated at the site patient reports feeling something and did not reveal any abnormalities.    Patient had positive C. difficile in April 2019, after taking clindamycin and has undergone treatment with vancomycin prescribed by her primary care provider. Her symptoms of C. difficile consisted of thin, "small snakelike" loose stools.   She had a colonoscopy for screening 2008 by Dr. Allen Norris.Sigmoid colon polyp was removed, and as per an [facility] colonoscopy encounter in her chart, it was a hyperplastic polyp.  No family history of colon cancer  Current Outpatient Medications  Medication Sig Dispense Refill  . amLODipine (NORVASC) 2.5 MG tablet TAKE 1 TABLET BY MOUTH EVERY DAY 30 tablet 11  . calcium carbonate (OS-CAL) 600 MG TABS tablet Take 600 mg by mouth daily with breakfast.     . levothyroxine (SYNTHROID, LEVOTHROID) 100 MCG tablet Take 1  tablet (100 mcg total) by mouth daily. 90 tablet 4  . Multiple Vitamin tablet Take 1 tablet by mouth daily.      No current facility-administered medications for this visit.     Allergies as of 11/07/2017 - Review Complete 11/07/2017  Allergen Reaction Noted  . Clindamycin/lincomycin Diarrhea 09/19/2017  . Sulfa antibiotics  03/05/2015  . Other Rash 03/05/2015    ROS:  General: Negative for anorexia, weight loss, fever, chills, fatigue, weakness. ENT: Negative for hoarseness, difficulty swallowing , nasal congestion. CV: Negative for chest pain, angina, palpitations, dyspnea on exertion, peripheral edema.  Respiratory: Negative for dyspnea at rest, dyspnea on exertion, cough, sputum, wheezing.  GI: See history of present illness. GU:  Negative for dysuria, hematuria, urinary incontinence, urinary frequency, nocturnal urination.  Endo: Negative for unusual weight change.    Physical Examination:   BP 136/75   Pulse 84   Ht 5\' 1"  (1.549 m)   Wt 104 lb 3.2 oz (47.3 kg)   BMI 19.69 kg/m   General: Well-nourished, well-developed in no acute distress.  Eyes: No icterus. Conjunctivae pink. Mouth: Oropharyngeal mucosa moist and pink , no lesions erythema or exudate. Neck: Supple, Trachea midline Abdomen: Bowel sounds are normal, nontender, nondistended, no hepatosplenomegaly or masses, no abdominal bruits or hernia , no rebound or guarding.   Extremities: No lower extremity edema. No clubbing or deformities. Neuro: Alert and oriented x 3.  Grossly intact. Skin: Warm and dry, no jaundice.   Psych:  Alert and cooperative, normal mood and affect.   Labs: CMP     Component Value Date/Time   NA 144 04/10/2017 1006   K 4.2 04/10/2017 1006   CL 102 04/10/2017 1006   CO2 25 04/10/2017 1006   GLUCOSE 90 04/10/2017 1006   BUN 12 04/10/2017 1006   CREATININE 0.92 04/10/2017 1006   CALCIUM 9.9 04/10/2017 1006   PROT 7.4 04/10/2017 1006   ALBUMIN 4.8 04/10/2017 1006   AST 25  04/10/2017 1006   ALT 20 04/10/2017 1006   ALKPHOS 76 04/10/2017 1006   BILITOT 0.6 04/10/2017 1006   GFRNONAA 60 04/10/2017 1006   GFRAA 69 04/10/2017 1006   No results found for: WBC, HGB, HCT, MCV, PLT  Imaging Studies: No results found.  Assessment and Plan:   [name] is a 77 y.o. y/o female here for follow-up, with history of C. difficile in April 2019, and her main complaint to be feeling a pressure sensation in her rectum, with negative rectal exam, reassuring lab findings  Due to her concern, we have discussed colonoscopy again, and alternative of conservative management given that her bowel movements are soft, labs reassuring, and her pressure-like sensation is localized at the coccyx, and given that she is a thin female, she may just be feeling her coccyx when she sits down.  However, we also discussed that her last colonoscopy was 10 years ago, and a colonic lesion, as the rectal lesion cannot be ruled out until visualization with a colonoscopy.  She would like to proceed with colonoscopy at this time  I have discussed alternative options, risks & benefits,  which include, but are not limited to, bleeding, infection, perforation,respiratory complication & drug reaction.  The patient agrees with this plan & written consent will be obtained.    She tried MiraLAX and despite having soft bowel movements with it, she is not sure if pressure-like sensation was better when she was taking MiraLAX and she stopped it due to loose stools.  At this time she has soft bowel movements without straining, and continues to have feeling of incomplete evacuation at times, and pressure-like sensation at the coccyx, despite normal physical exam skin in the area.  If colonoscopy is negative, and patient would like to continue with further evaluation, MRI of the pelvis can be considered at that time   Dr [name]

## 2017-11-09 NOTE — Addendum Note (Signed)
Addended by: Earl Lagos on: 11/09/2017 12:22 PM   Modules accepted: Orders, SmartSet

## 2017-11-15 DIAGNOSIS — R69 Illness, unspecified: Secondary | ICD-10-CM | POA: Diagnosis not present

## 2017-11-22 ENCOUNTER — Telehealth: Payer: Self-pay | Admitting: Gastroenterology

## 2017-11-22 NOTE — Telephone Encounter (Signed)
Questions answered. To stop calcium and MTV 5 days before procedure. May take other meds up to the evening prior. Nothing to drink after midnight except prep. Pt to contact office if any further questions.

## 2017-11-22 NOTE — Telephone Encounter (Signed)
Pt is calling  To ask questions about her up coming procedures please call pt  Its about stopping  Her calcium  And rx Liverzoroczide  Also about how to take the prep with 8 oz  With water???

## 2017-11-30 ENCOUNTER — Ambulatory Visit: Payer: Medicare HMO | Admitting: Anesthesiology

## 2017-11-30 ENCOUNTER — Encounter
Admission: RE | Disposition: A | Discharge status: Home / Self Care | Payer: Self-pay | Source: Ambulatory Visit | Attending: Gastroenterology

## 2017-11-30 ENCOUNTER — Ambulatory Visit
Admission: RE | Admit: 2017-11-30 | Discharge: 2017-11-30 | Disposition: A | Discharge status: Home / Self Care | Payer: Medicare HMO | Source: Ambulatory Visit | Attending: Gastroenterology | Admitting: Gastroenterology

## 2017-11-30 ENCOUNTER — Encounter: Payer: Self-pay | Admitting: *Deleted

## 2017-11-30 DIAGNOSIS — D122 Benign neoplasm of ascending colon: Secondary | ICD-10-CM | POA: Insufficient documentation

## 2017-11-30 DIAGNOSIS — I341 Nonrheumatic mitral (valve) prolapse: Secondary | ICD-10-CM | POA: Diagnosis not present

## 2017-11-30 DIAGNOSIS — Z85828 Personal history of other malignant neoplasm of skin: Secondary | ICD-10-CM | POA: Diagnosis not present

## 2017-11-30 DIAGNOSIS — Z79890 Hormone replacement therapy (postmenopausal): Secondary | ICD-10-CM | POA: Diagnosis not present

## 2017-11-30 DIAGNOSIS — K635 Polyp of colon: Secondary | ICD-10-CM | POA: Diagnosis not present

## 2017-11-30 DIAGNOSIS — K6289 Other specified diseases of anus and rectum: Secondary | ICD-10-CM

## 2017-11-30 DIAGNOSIS — R198 Other specified symptoms and signs involving the digestive system and abdomen: Secondary | ICD-10-CM

## 2017-11-30 DIAGNOSIS — Z1211 Encounter for screening for malignant neoplasm of colon: Secondary | ICD-10-CM

## 2017-11-30 DIAGNOSIS — Z79899 Other long term (current) drug therapy: Secondary | ICD-10-CM | POA: Diagnosis not present

## 2017-11-30 DIAGNOSIS — I10 Essential (primary) hypertension: Secondary | ICD-10-CM | POA: Insufficient documentation

## 2017-11-30 DIAGNOSIS — Z860101 Personal history of adenomatous and serrated colon polyps: Secondary | ICD-10-CM

## 2017-11-30 DIAGNOSIS — E890 Postprocedural hypothyroidism: Secondary | ICD-10-CM | POA: Insufficient documentation

## 2017-11-30 HISTORY — PX: COLONOSCOPY WITH PROPOFOL: SHX5780

## 2017-11-30 HISTORY — DX: Hypothyroidism, unspecified: E03.9

## 2017-11-30 HISTORY — DX: Nonrheumatic mitral (valve) prolapse: I34.1

## 2017-11-30 HISTORY — DX: Essential (primary) hypertension: I10

## 2017-11-30 SURGERY — COLONOSCOPY WITH PROPOFOL
Anesthesia: General

## 2017-11-30 MED ORDER — MIDAZOLAM HCL 2 MG/2ML IJ SOLN
INTRAMUSCULAR | Status: DC | PRN
  Administered 2017-11-30 (×2): 1 mg via INTRAVENOUS

## 2017-11-30 MED ORDER — PROPOFOL 500 MG/50ML IV EMUL
INTRAVENOUS | Status: DC | PRN
  Administered 2017-11-30: 100 ug/kg/min via INTRAVENOUS

## 2017-11-30 MED ORDER — FENTANYL CITRATE (PF) 100 MCG/2ML IJ SOLN
INTRAMUSCULAR | Status: AC
  Filled 2017-11-30: qty 2

## 2017-11-30 MED ORDER — EPHEDRINE SULFATE 50 MG/ML IJ SOLN
INTRAMUSCULAR | Status: DC | PRN
  Administered 2017-11-30: 5 mg via INTRAVENOUS

## 2017-11-30 MED ORDER — FENTANYL CITRATE (PF) 100 MCG/2ML IJ SOLN
INTRAMUSCULAR | Status: DC | PRN
  Administered 2017-11-30 (×2): 25 ug via INTRAVENOUS
  Administered 2017-11-30: 50 ug via INTRAVENOUS

## 2017-11-30 MED ORDER — SODIUM CHLORIDE 0.9 % IV SOLN
INTRAVENOUS | Status: DC
  Administered 2017-11-30: 08:00:00 via INTRAVENOUS

## 2017-11-30 MED ORDER — PROPOFOL 500 MG/50ML IV EMUL
INTRAVENOUS | Status: AC
  Filled 2017-11-30: qty 50

## 2017-11-30 MED ORDER — MIDAZOLAM HCL 2 MG/2ML IJ SOLN
INTRAMUSCULAR | Status: AC
  Filled 2017-11-30: qty 2

## 2017-11-30 MED ORDER — EPHEDRINE SULFATE 50 MG/ML IJ SOLN
INTRAMUSCULAR | Status: AC
  Filled 2017-11-30: qty 1

## 2017-11-30 NOTE — Transfer of Care (Signed)
Immediate Anesthesia Transfer of Care Note  Patient: Suzanne Morgan  Procedure(s) Performed: COLONOSCOPY WITH PROPOFOL (N/A )  Patient Location: PACU  Anesthesia Type:General  Level of Consciousness: awake and sedated  Airway & Oxygen Therapy: Patient Spontanous Breathing and Patient connected to nasal cannula oxygen  Post-op Assessment: Report given to RN and Post -op Vital signs reviewed and stable  Post vital signs: Reviewed and stable  Last Vitals:  Vitals Value Taken Time  BP    Temp    Pulse    Resp    SpO2      Last Pain:  Vitals:   11/30/17 0811  TempSrc: Tympanic  PainSc: 0-No pain         Complications: No apparent anesthesia complications

## 2017-11-30 NOTE — Anesthesia Postprocedure Evaluation (Signed)
Anesthesia Post Note  Patient: Suzanne Morgan  Procedure(s) Performed: COLONOSCOPY WITH PROPOFOL (N/A )  Patient location during evaluation: PACU Anesthesia Type: General Level of consciousness: awake and alert Pain management: pain level controlled Vital Signs Assessment: post-procedure vital signs reviewed and stable Respiratory status: spontaneous breathing, nonlabored ventilation and respiratory function stable Cardiovascular status: blood pressure returned to baseline and stable Postop Assessment: no apparent nausea or vomiting Anesthetic complications: no     Last Vitals:  Vitals:   11/30/17 1001 11/30/17 1021  BP: (!) 91/56 109/67  Pulse: 77   Resp: 15   Temp: (!) 36.2 C   SpO2: 99%     Last Pain:  Vitals:   11/30/17 1031  TempSrc:   PainSc: 0-No pain                 Durenda Hurt

## 2017-11-30 NOTE — Anesthesia Preprocedure Evaluation (Signed)
Anesthesia Evaluation  Patient identified by MRN, date of birth, ID band Patient awake    Reviewed: Allergy & Precautions, H&P , NPO status , Patient's Chart, lab work & pertinent test results  Airway Mallampati: I  TM Distance: >3 FB Neck ROM: full    Dental  (+) Teeth Intact, Implants, Caps   Pulmonary neg pulmonary ROS, neg COPD,           Cardiovascular hypertension, (-) CAD, (-) Cardiac Stents, (-) CABG and (-) DVT (-) dysrhythmias + Valvular Problems/Murmurs MVP      Neuro/Psych negative neurological ROS  negative psych ROS   GI/Hepatic negative GI ROS, Neg liver ROS,   Endo/Other  negative endocrine ROSHypothyroidism   Renal/GU negative Renal ROS  negative genitourinary   Musculoskeletal   Abdominal   Peds  Hematology negative hematology ROS (+)   Anesthesia Other Findings Past Medical History: 12/12/2008: Herpes zoster without complication No date: History of chicken pox No date: History of mumps No date: Hypertension No date: Hypothyroidism No date: Mitral valve prolapse No date: Squamous cell carcinoma  Past Surgical History: many yrs ago: BREAST CYST ASPIRATION; Right     Comment:  negative No date: THYROIDECTOMY 06/13/07: UPPER GI ENDOSCOPY     Comment:  HIATAL HERNIA, IRREGEULAR Z-LINE  BMI    Body Mass Index:  19.84 kg/m      Reproductive/Obstetrics negative OB ROS                             Anesthesia Physical Anesthesia Plan  ASA: II  Anesthesia Plan: General   Post-op Pain Management:    Induction:   PONV Risk Score and Plan: Propofol infusion and TIVA  Airway Management Planned:   Additional Equipment:   Intra-op Plan:   Post-operative Plan:   Informed Consent: I have reviewed the patients History and Physical, chart, labs and discussed the procedure including the risks, benefits and alternatives for the proposed anesthesia with the patient or  authorized representative who has indicated his/her understanding and acceptance.   Dental Advisory Given  Plan Discussed with: Anesthesiologist, CRNA and Surgeon  Anesthesia Plan Comments:         Anesthesia Quick Evaluation

## 2017-11-30 NOTE — Anesthesia Post-op Follow-up Note (Signed)
Anesthesia QCDR form completed.        

## 2017-11-30 NOTE — Anesthesia Procedure Notes (Signed)
Performed by: Cook-Martin, Neveyah Garzon Pre-anesthesia Checklist: Patient identified, Emergency Drugs available, Suction available, Patient being monitored and Timeout performed Patient Re-evaluated:Patient Re-evaluated prior to induction Oxygen Delivery Method: Nasal cannula Preoxygenation: Pre-oxygenation with 100% oxygen Induction Type: IV induction Placement Confirmation: positive ETCO2 and CO2 detector       

## 2017-11-30 NOTE — H&P (Signed)
[name], MD [address] [place], [place], [place], [address] [address], [place], [place], [place], [address] Phone: [phone]  Fax: [phone]  Primary Care Physician:  Birdie Sons, MD   Pre-Procedure History & Physical: HPI:  Suzanne Morgan is a 77 y.o. female is here for a colonoscopy.   Past Medical History:  Diagnosis Date  . Herpes zoster without complication 28/31/5176  . History of chicken pox   . History of mumps   . Hypertension   . Hypothyroidism   . Mitral valve prolapse   . Squamous cell carcinoma     Past Surgical History:  Procedure Laterality Date  . BREAST CYST ASPIRATION Right many yrs ago   negative  . THYROIDECTOMY    . UPPER GI ENDOSCOPY  06/13/07   HIATAL HERNIA, IRREGEULAR Z-LINE    Prior to Admission medications   Medication Sig Start Date End Date Taking? Authorizing Provider  amLODipine (NORVASC) 2.5 MG tablet TAKE 1 TABLET BY MOUTH EVERY DAY 07/02/17  Yes Birdie Sons, MD  calcium carbonate (OS-CAL) 600 MG TABS tablet Take 600 mg by mouth daily with breakfast.  12/26/06  Yes [provider]  levothyroxine (SYNTHROID, LEVOTHROID) 100 MCG tablet Take 1 tablet (100 mcg total) by mouth daily. 01/04/17  Yes Birdie Sons, MD  Multiple Vitamin tablet Take 1 tablet by mouth daily.  12/13/05  Yes [provider]    Allergies as of 11/09/2017 - Review Complete 11/07/2017  Allergen Reaction Noted  . Clindamycin/lincomycin Diarrhea 09/19/2017  . Sulfa antibiotics  03/05/2015  . Other Rash 03/05/2015    Family History  Problem Relation Age of Onset  . Stroke Mother   . Heart attack Father     Social History   Socioeconomic History  . Marital status: Married    Spouse name: Not on file  . Number of children: 1  . Years of education: Not on file  . Highest education level: Bachelor's degree (e.g., BA, AB, BS)  Occupational History  . Not on file  Social Needs  . Financial resource strain:  Not hard at all  . Food insecurity:    Worry: Never true    Inability: Never true  . Transportation needs:    Medical: No    Non-medical: No  Tobacco Use  . Smoking status: Never Smoker  . Smokeless tobacco: Never Used  Substance and Sexual Activity  . Alcohol use: Yes    Alcohol/week: 0.0 - 1.0 standard drinks  . Drug use: No  . Sexual activity: Not on file  Lifestyle  . Physical activity:    Days per week: Not on file    Minutes per session: Not on file  . Stress: Only a little  Relationships  . Social connections:    Talks on phone: Not on file    Gets together: Not on file    Attends religious service: Not on file    Active member of club or organization: Not on file    Attends meetings of clubs or organizations: Not on file    Relationship status: Not on file  . Intimate partner violence:    Fear of current or ex partner: Not on file    Emotionally abused: Not on file    Physically abused: Not on file    Forced sexual activity: Not on file  Other Topics Concern  . Not on file  Social History Narrative   Pt as 1 biological child and 2 adopted.  Review of Systems: See HPI, otherwise negative ROS  Physical Exam: BP 133/81   Pulse (!) 105   Temp (!) 97.2 F (36.2 C) (Tympanic)   Resp 16   Wt 47.6 kg   SpO2 100%   BMI 19.84 kg/m  General:   Alert,  pleasant and cooperative in NAD Head:  Normocephalic and atraumatic. Neck:  Supple; no masses or thyromegaly. Lungs:  Clear throughout to auscultation, normal respiratory effort.    Heart:  +S1, +S2, Regular rate and rhythm, No edema. Abdomen:  Soft, nontender and nondistended. Normal bowel sounds, without guarding, and without rebound.   Neurologic:  Alert and  oriented x4;  grossly normal neurologically.  Impression/Plan: Suzanne Morgan is here for a colonoscopy to be performed for average risk screening and pressure like sensation in the rectum.  Risks, benefits, limitations, and alternatives regarding   colonoscopy have been reviewed with the patient.  Questions have been answered.  All parties agreeable.   Virgel Manifold, MD  11/30/2017, 9:10 AM

## 2017-11-30 NOTE — Op Note (Signed)
[facility] Gastroenterology Patient Name: Suzanne Morgan Procedure Date: 11/30/2017 9:09 AM MRN: [id number] Account #: [id number] Date of Birth: 1940/05/21 Admit Type: Outpatient Age: 77 Room: [facility] ENDO ROOM 2 Gender: Female Note Status: Finalized Procedure:            Colonoscopy Indications:          Screening for colorectal malignant neoplasm,                        Intermittent rectal pressure. Providers:            Lennette Bihari. Bonna Gains MD, MD Referring MD:         Kirstie Peri. Caryn Section, MD (Referring MD) Medicines:            Monitored Anesthesia Care Complications:        No immediate complications. Procedure:            Pre-Anesthesia Assessment:                       - ASA Grade Assessment: II - A patient with mild                        systemic disease.                       - Prior to the procedure, a History and Physical was                        performed, and patient medications, allergies and                        sensitivities were reviewed. The patient's tolerance of                        previous anesthesia was reviewed.                       - The risks and benefits of the procedure and the                        sedation options and risks were discussed with the                        patient. All questions were answered and informed                        consent was obtained.                       - Patient identification and proposed procedure were                        verified prior to the procedure by the physician, the                        nurse, the anesthesiologist, the anesthetist and the                        technician. The procedure was verified in the procedure  room.                       After obtaining informed consent, the colonoscope was                        passed under direct vision. Throughout the procedure,                        the patient's blood pressure, pulse, and oxygen                         saturations were monitored continuously. The                        Colonoscope was introduced through the anus and                        advanced to the the cecum, identified by appendiceal                        orifice and ileocecal valve. The colonoscopy was                        performed with ease. The patient tolerated the                        procedure well. The quality of the bowel preparation                        was good. Findings:      The perianal and digital rectal examinations were normal.      A 5 mm polyp was found in the ascending colon. The polyp was sessile.       The polyp was removed with a cold snare. Resection and retrieval were       complete.      The exam was otherwise without abnormality.      The rectum, sigmoid colon, descending colon, transverse colon, ascending       colon and cecum appeared normal.      Anal papilla(e) were hypertrophied.      No additional abnormalities were found on retroflexion. Impression:           - One 5 mm polyp in the ascending colon, removed with a                        cold snare. Resected and retrieved.                       - The examination was otherwise normal.                       - The rectum, sigmoid colon, descending colon,                        transverse colon, ascending colon and cecum are normal.                       - Anal papilla(e) were hypertrophied. Recommendation:       - Discharge patient to home (with escort).                       -  Advance diet as tolerated.                       - Continue present medications.                       - Await pathology results.                       - Repeat colonoscopy in 5 years if polyp shows adenoma.                        If polyp is hyperplastic, no further screening                        colonoscopy indicated due to patient's age.                       - The findings and recommendations were discussed with                        the patient.                        - The findings and recommendations were discussed with                        the patient's family.                       - Return to primary care physician as previously                        scheduled.                       - In the future, if patient develops new symptoms such                        as blood per rectum, abdominal pain, weight loss,                        altered bowel habits or any other reason for concern,                        patient should discuss this with her PCP as she may                        need a GI referral at that time or evaluation for need                        for colonoscopy earlier than her recommended screening                        colonoscopy.                       In addition, if patient's family history of colon                        cancer changes (no family history at this time) in the  future, earlier screening may be indicated and patient                        should discuss this with PCP as well. Procedure Code(s):    --- Professional ---                       [phone], Colonoscopy, flexible; with removal of tumor(s),                        polyp(s), or other lesion(s) by snare technique Diagnosis Code(s):    --- Professional ---                       Z12.11, Encounter for screening for malignant neoplasm                        of colon                       D12.2, Benign neoplasm of ascending colon                       K62.89, Other specified diseases of anus and rectum CPT copyright 2018 American Medical Association. All rights reserved. The codes documented in this report are preliminary and upon coder review may  be revised to meet current compliance requirements.  [name], MD Margretta Sidle B. Bonna Gains MD, MD 11/30/2017 10:00:16 AM This report has been signed electronically. Number of Addenda: 0 Note Initiated On: 11/30/2017 9:09 AM Scope Withdrawal Time: 0 hours 18 minutes 33 seconds   Total Procedure Duration: 0 hours 28 minutes 20 seconds  Estimated Blood Loss: Estimated blood loss: none.      [facility]

## 2017-12-01 LAB — SURGICAL PATHOLOGY

## 2017-12-04 ENCOUNTER — Encounter: Payer: Self-pay | Admitting: Gastroenterology

## 2017-12-12 DIAGNOSIS — R69 Illness, unspecified: Secondary | ICD-10-CM | POA: Diagnosis not present

## 2018-02-14 DIAGNOSIS — L570 Actinic keratosis: Secondary | ICD-10-CM | POA: Diagnosis not present

## 2018-02-28 ENCOUNTER — Other Ambulatory Visit: Payer: Self-pay | Admitting: Family Medicine

## 2018-04-12 ENCOUNTER — Ambulatory Visit: Payer: Self-pay

## 2018-04-16 ENCOUNTER — Encounter: Payer: Medicare HMO | Admitting: Family Medicine

## 2018-04-16 ENCOUNTER — Ambulatory Visit: Payer: Medicare HMO

## 2018-04-23 ENCOUNTER — Ambulatory Visit: Payer: Medicare HMO

## 2018-05-11 ENCOUNTER — Telehealth: Payer: Self-pay

## 2018-05-11 NOTE — Telephone Encounter (Signed)
Patient called office wanting to speak with nurse to discuss the need for physical. KW

## 2018-06-26 ENCOUNTER — Other Ambulatory Visit: Payer: Self-pay | Admitting: Family Medicine

## 2018-06-26 DIAGNOSIS — I10 Essential (primary) hypertension: Secondary | ICD-10-CM

## 2018-06-26 MED ORDER — AMLODIPINE BESYLATE 2.5 MG PO TABS: 2.5000 mg | ORAL_TABLET | Freq: Every day | ORAL | 11 refills | Status: DC

## 2018-06-26 NOTE — Telephone Encounter (Signed)
Needs a refill on  Amlodipine 2.5  [facility]

## 2018-06-26 NOTE — Telephone Encounter (Signed)
Last OV was 07/03/2017  Please advise.   Thanks,   -Mickel Baas

## 2018-08-13 ENCOUNTER — Other Ambulatory Visit: Payer: Self-pay | Admitting: Family Medicine

## 2018-08-13 NOTE — Telephone Encounter (Signed)
Pt wanting to know if she should come in and have the blood work done to continue with levothyroxine (SYNTHROID, LEVOTHROID) 100 MCG tablet?  Pt wanting to get it filled but doesn't want to have to come in to have the blood work done.  Please advise.  Thanks, [employer]

## 2018-08-13 NOTE — Telephone Encounter (Signed)
She needs to at least get a TSH checked. She can go to another [facility] draw station if she doesn't want to come here.

## 2018-08-13 NOTE — Telephone Encounter (Signed)
Please advise. Patient requesting a refill. Patient has not been seen in the office or had labs done in over 1 year.

## 2018-08-14 NOTE — Telephone Encounter (Signed)
Pt states she doesn't need a refill at this time, but would like to get her thyroid checked (also other labs that may be due).  She is  Not comfortable with coming into the office for a physical yet.    Pt states she is going out of town for two weeks and is leaving tomorrow.  She states she will call when she gets home to get the lab sheet.   Thanks,   -Mickel Baas

## 2018-08-28 ENCOUNTER — Telehealth: Payer: Self-pay | Admitting: Family Medicine

## 2018-08-28 DIAGNOSIS — E890 Postprocedural hypothyroidism: Secondary | ICD-10-CM

## 2018-08-28 DIAGNOSIS — R7989 Other specified abnormal findings of blood chemistry: Secondary | ICD-10-CM

## 2018-08-28 DIAGNOSIS — M8580 Other specified disorders of bone density and structure, unspecified site: Secondary | ICD-10-CM

## 2018-08-28 DIAGNOSIS — R945 Abnormal results of liver function studies: Secondary | ICD-10-CM

## 2018-08-28 NOTE — Telephone Encounter (Signed)
Pt has not been in the office since 06/2017 please advise.    Thanks,   -Mickel Baas

## 2018-08-28 NOTE — Telephone Encounter (Signed)
Pt wanting to come in to have labs done.  Letting Dr. Caryn Section know so they can be put up front.  Thanks, [employer]

## 2018-08-29 DIAGNOSIS — E890 Postprocedural hypothyroidism: Secondary | ICD-10-CM | POA: Diagnosis not present

## 2018-08-29 DIAGNOSIS — M8580 Other specified disorders of bone density and structure, unspecified site: Secondary | ICD-10-CM | POA: Diagnosis not present

## 2018-08-29 DIAGNOSIS — R945 Abnormal results of liver function studies: Secondary | ICD-10-CM | POA: Diagnosis not present

## 2018-08-29 DIAGNOSIS — M85852 Other specified disorders of bone density and structure, left thigh: Secondary | ICD-10-CM | POA: Diagnosis not present

## 2018-08-30 LAB — COMPREHENSIVE METABOLIC PANEL
ALT: 24 IU/L (ref 0–32)
AST: 32 IU/L (ref 0–40)
Albumin/Globulin Ratio: 2 (ref 1.2–2.2)
Albumin: 4.8 g/dL — ABNORMAL HIGH (ref 3.7–4.7)
Alkaline Phosphatase: 74 IU/L (ref 39–117)
BUN/Creatinine Ratio: 14 (ref 12–28)
BUN: 13 mg/dL (ref 8–27)
Bilirubin Total: 0.7 mg/dL (ref 0.0–1.2)
CO2: 24 mmol/L (ref 20–29)
Calcium: 9.4 mg/dL (ref 8.7–10.3)
Chloride: 101 mmol/L (ref 96–106)
Creatinine, Ser: 0.95 mg/dL (ref 0.57–1.00)
GFR calc Af Amer: 66 mL/min/{1.73_m2} (ref >59)
GFR calc non Af Amer: 57 mL/min/{1.73_m2} — ABNORMAL LOW (ref >59)
Globulin, Total: 2.4 g/dL (ref 1.5–4.5)
Glucose: 112 mg/dL — ABNORMAL HIGH (ref 65–99)
Potassium: 4.1 mmol/L (ref 3.5–5.2)
Sodium: 141 mmol/L (ref 134–144)
Total Protein: 7.2 g/dL (ref 6.0–8.5)

## 2018-08-30 LAB — VITAMIN D 25 HYDROXY (VIT D DEFICIENCY, FRACTURES): Vit D, 25-Hydroxy: 42.3 ng/mL (ref 30.0–100.0)

## 2018-08-30 LAB — TSH: TSH: 3.01 u[IU]/mL (ref 0.450–4.500)

## 2018-09-11 NOTE — Progress Notes (Signed)
Subjective:   Suzanne Morgan is a 78 y.o. female who presents for Medicare Annual (Subsequent) preventive examination.    This visit is being conducted through telemedicine due to the COVID-19 pandemic. This patient has given me verbal consent via doximity to conduct this visit, patient states they are participating from their home address. Some vital signs may be absent or patient reported.    Patient identification: identified by name, DOB, and current address  Review of Systems:  N/A  Cardiac Risk Factors include: advanced age (>27men, >66 women);hypertension     Objective:     Vitals: BP 113/75 (BP Location: Left Arm) per pt.  There is no height or weight on file to calculate BMI. Unable to obtain vitals due to visit being conducted via telephonically.   Advanced Directives 09/12/2018 04/10/2017  Does Patient Have a Medical Advance Directive? Yes Yes  Type of Paramedic of [place];Living will Living will;Healthcare Power of [place] in Chart? No - copy requested No - copy requested    Tobacco Social History   Tobacco Use  Smoking Status Never Smoker  Smokeless Tobacco Never Used     Counseling given: Not Answered   Clinical Intake:  Pre-visit preparation completed: Yes  Pain : No/denies pain Pain Score: 0-No pain     Nutritional Risks: None Diabetes: No  How often do you need to have someone help you when you read instructions, pamphlets, or other written materials from your doctor or pharmacy?: 1 - Never  Interpreter Needed?: No  Information entered by :: [facility] LPN  Past Medical History:  Diagnosis Date  . Herpes zoster without complication 16/10/9602  . History of chicken pox   . History of mumps   . Hypertension   . Hypothyroidism   . Mitral valve prolapse   . Squamous cell carcinoma    Past Surgical History:  Procedure Laterality Date  . BREAST CYST ASPIRATION Right many yrs  ago   negative  . COLONOSCOPY WITH PROPOFOL N/A 11/30/2017   Procedure: COLONOSCOPY WITH PROPOFOL;  Surgeon: Virgel Manifold, MD;  Location: [facility] ENDOSCOPY;  Service: Endoscopy;  Laterality: N/A;  . THYROIDECTOMY    . UPPER GI ENDOSCOPY  06/13/07   HIATAL HERNIA, IRREGEULAR Z-LINE   Family History  Problem Relation Age of Onset  . Stroke Mother   . Heart attack Father    Social History   Socioeconomic History  . Marital status: Married    Spouse name: Not on file  . Number of children: 1  . Years of education: 2 adopted children  . Highest education level: Bachelor's degree (e.g., BA, AB, BS)  Occupational History  . Not on file  Social Needs  . Financial resource strain: Not hard at all  . Food insecurity    Worry: Never true    Inability: Never true  . Transportation needs    Medical: No    Non-medical: No  Tobacco Use  . Smoking status: Never Smoker  . Smokeless tobacco: Never Used  Substance and Sexual Activity  . Alcohol use: Yes    Alcohol/week: 1.0 - 2.0 standard drinks    Types: 1 - 2 Glasses of wine per week  . Drug use: No  . Sexual activity: Not on file  Lifestyle  . Physical activity    Days per week: 0 days    Minutes per session: 0 min  . Stress: Not at all  Relationships  .  Social Herbalist on phone: Patient refused    Gets together: Patient refused    Attends religious service: Patient refused    Active member of club or organization: Patient refused    Attends meetings of clubs or organizations: Patient refused    Relationship status: Patient refused  Other Topics Concern  . Not on file  Social History Narrative   Pt as 1 biological child and 2 adopted.    Outpatient Encounter Medications as of 09/12/2018  Medication Sig  . amLODipine (NORVASC) 2.5 MG tablet Take 1 tablet (2.5 mg total) by mouth daily.  . calcium carbonate (OS-CAL) 600 MG TABS tablet Take 600 mg by mouth daily with breakfast.   . levothyroxine (SYNTHROID,  LEVOTHROID) 100 MCG tablet TAKE 1 TABLET BY MOUTH EVERY DAY  . Multiple Vitamin tablet Take 1 tablet by mouth daily.    No facility-administered encounter medications on file as of 09/12/2018.     Activities of Daily Living In your present state of health, do you have any difficulty performing the following activities: 09/12/2018  Hearing? N  Vision? N  Difficulty concentrating or making decisions? N  Walking or climbing stairs? N  Dressing or bathing? N  Doing errands, shopping? N  Preparing Food and eating ? N  Using the Toilet? N  In the past six months, have you accidently leaked urine? Y  Comment Occasionally.  Do you have problems with loss of bowel control? N  Managing your Medications? N  Managing your Finances? N  Housekeeping or managing your Housekeeping? N  Some recent data might be hidden    Patient Care Team: Birdie Sons, MD as PCP - General (Family Medicine) Pa, Quail as Consulting Physician (Optometry) Benitez-Graham, Cori Razor, MD (Dermatology) Virgel Manifold, MD as Consulting Physician (Gastroenterology)    Assessment:   This is a routine wellness examination for Suzanne Morgan.  Exercise Activities and Dietary recommendations Current Exercise Habits: Home exercise routine, Type of exercise: walking, Time (Minutes): 30, Frequency (Times/Week): 5, Weekly Exercise (Minutes/Week): 150, Intensity: Mild, Exercise limited by: None identified  Goals    . DIET - INCREASE WATER INTAKE     Recommend increasing water intake to 4 glasses a day.       Fall Risk: Fall Risk  09/12/2018 04/10/2017 03/24/2016 03/23/2015  Falls in the past year? 0 No No No    FALL RISK PREVENTION PERTAINING TO THE HOME:  Any stairs in or around the home? Yes  If so, are there any without handrails? No   Home free of loose throw rugs in walkways, pet beds, electrical cords, etc? Yes  Adequate lighting in your home to reduce risk of falls? Yes   ASSISTIVE DEVICES UTILIZED TO  PREVENT FALLS:  Life alert? No  Use of a cane, walker or w/c? No  Grab bars in the bathroom? No  Shower chair or bench in shower? No  Elevated toilet seat or a handicapped toilet? No    TIMED UP AND GO:  Was the test performed? No .    Depression Screen PHQ 2/9 Scores 09/12/2018 04/10/2017 04/10/2017 03/24/2016  PHQ - 2 Score 0 0 0 0  PHQ- 9 Score - 1 - 1     Cognitive Function     6CIT Screen 09/12/2018 04/10/2017  What Year? 0 points 0 points  What month? 0 points 0 points  What time? 0 points 0 points  Count back from 20 0 points 0 points  Months in reverse 0 points 0 points  Repeat phrase 0 points 0 points  Total Score 0 0    Immunization History  Administered Date(s) Administered  . Hepatitis B 10/11/1991, 11/13/1991, 04/09/1992  . Influenza Split 11/02/2006  . Influenza, High Dose Seasonal PF 10/30/2015, 10/19/2017  . Influenza-Unspecified 11/14/2016  . Pneumococcal Conjugate-13 03/20/2014  . Pneumococcal Polysaccharide-23 11/19/2003, 12/27/2010  . Td 12/09/2008  . Zoster 02/08/2006    Qualifies for Shingles Vaccine? Yes  Zostavax completed 02/08/06. Due for Shingrix. Education has been provided regarding the importance of this vaccine. Pt has been advised to call insurance company to determine out of pocket expense. Advised may also receive vaccine at local pharmacy or Health Dept. Verbalized acceptance and understanding.  Tdap: Up to date  Flu Vaccine: Due fall 2020  Pneumococcal Vaccine: Completed series   Screening Tests Health Maintenance  Topic Date Due  . INFLUENZA VACCINE  08/25/2018  . TETANUS/TDAP  12/10/2018  . DEXA SCAN  05/17/2019  . COLONOSCOPY  12/01/2022  . PNA vac Low Risk Adult  Completed    Cancer Screenings:  Colorectal Screening: Completed 11/30/17. Repeat every 5 years.  Mammogram: No longer required.   Bone Density: Completed 05/16/16. Results reflect OSTEOPENIA. Repeat every 3 years.   Lung Cancer Screening: (Low Dose CT Chest  recommended if Age 53-80 years, 30 pack-year currently smoking OR have quit w/in 15years.) does not qualify.   Additional Screening:  Vision Screening: Recommended annual ophthalmology exams for early detection of glaucoma and other disorders of the eye.  Dental Screening: Recommended annual dental exams for proper oral hygiene  Community Resource Referral:  CRR required this visit?  No       Plan:  I have personally reviewed and addressed the Medicare Annual Wellness questionnaire and have noted the following in the patient's chart:  A. Medical and social history B. Use of alcohol, tobacco or illicit drugs  C. Current medications and supplements D. Functional ability and status E.  Nutritional status F.  Physical activity G. Advance directives H. List of other physicians I.  Hospitalizations, surgeries, and ER visits in previous 12 months J.  [place] such as hearing and vision if needed, cognitive and depression L. Referrals and appointments   In addition, I have reviewed and discussed with patient certain preventive protocols, quality metrics, and best practice recommendations. A written personalized care plan for preventive services as well as general preventive health recommendations were provided to patient. Nurse Health Advisor  Signed,    Adabella Stanis [place], [place]  04/12/2332 Nurse Health Advisor   Nurse Notes: None.

## 2018-09-12 ENCOUNTER — Ambulatory Visit (INDEPENDENT_AMBULATORY_CARE_PROVIDER_SITE_OTHER): Payer: Medicare HMO

## 2018-09-12 ENCOUNTER — Other Ambulatory Visit: Payer: Self-pay

## 2018-09-12 VITALS — BP 113/75

## 2018-09-12 DIAGNOSIS — Z0000 Encounter for general adult medical examination without abnormal findings: Secondary | ICD-10-CM | POA: Diagnosis not present

## 2018-09-12 NOTE — Patient Instructions (Signed)
Suzanne Morgan , Thank you for taking time to come for your Medicare Wellness Visit. I appreciate your ongoing commitment to your health goals. Please review the following plan we discussed and let me know if I can assist you in the future.   Screening recommendations/referrals: Colonoscopy: Up to date, due 11/2022 Mammogram: Up to date, due 05/2019 Bone Density: Up to date, due 04/2019 Recommended yearly ophthalmology/optometry visit for glaucoma screening and checkup Recommended yearly dental visit for hygiene and checkup  Vaccinations: Influenza vaccine: Due fall 2020 Pneumococcal vaccine: Completed series Tdap vaccine: Up to date, due 11/2018 Shingles vaccine: Pt declines today.     Advanced directives: Please bring a copy of your POA (Power of Attorney) and/or Living Will to your next appointment.   Conditions/risks identified: Continue to increase water intake to 6-8 8 oz glasses a day.   Next appointment: 10/23/18 for a flu shot. Declined scheduling a CPE for this year or an [place] for 2021 at this time. Pt to call back to schedule these appointments.    Preventive Care 3 Years and Older, Female Preventive care refers to lifestyle choices and visits with your health care provider that can promote health and wellness. What does preventive care include?  A yearly physical exam. This is also called an annual well check.  Dental exams once or twice a year.  Routine eye exams. Ask your health care provider how often you should have your eyes checked.  Personal lifestyle choices, including:  Daily care of your teeth and gums.  Regular physical activity.  Eating a healthy diet.  Avoiding tobacco and drug use.  Limiting alcohol use.  Practicing safe sex.  Taking low-dose aspirin every day.  Taking vitamin and mineral supplements as recommended by your health care provider. What happens during an annual well check? The services and screenings done by your health care  provider during your annual well check will depend on your age, overall health, lifestyle risk factors, and family history of disease. Counseling  Your health care provider may ask you questions about your:  Alcohol use.  Tobacco use.  Drug use.  Emotional well-being.  Home and relationship well-being.  Sexual activity.  Eating habits.  History of falls.  Memory and ability to understand (cognition).  Work and work Statistician.  Reproductive health. Screening  You may have the following tests or measurements:  Height, weight, and BMI.  Blood pressure.  Lipid and cholesterol levels. These may be checked every 5 years, or more frequently if you are over 75 years old.  Skin check.  Lung cancer screening. You may have this screening every year starting at age 60 if you have a 30-pack-year history of smoking and currently smoke or have quit within the past 15 years.  Fecal occult blood test (FOBT) of the stool. You may have this test every year starting at age 46.  Flexible sigmoidoscopy or colonoscopy. You may have a sigmoidoscopy every 5 years or a colonoscopy every 10 years starting at age 3.  Hepatitis C blood test.  Hepatitis B blood test.  Sexually transmitted disease (STD) testing.  Diabetes screening. This is done by checking your blood sugar (glucose) after you have not eaten for a while (fasting). You may have this done every 1-3 years.  Bone density scan. This is done to screen for osteoporosis. You may have this done starting at age 22.  Mammogram. This may be done every 1-2 years. Talk to your health care provider about how often you  should have regular mammograms. Talk with your health care provider about your test results, treatment options, and if necessary, the need for more tests. Vaccines  Your health care provider may recommend certain vaccines, such as:  Influenza vaccine. This is recommended every year.  Tetanus, diphtheria, and acellular  pertussis (Tdap, Td) vaccine. You may need a Td booster every 10 years.  Zoster vaccine. You may need this after age 30.  Pneumococcal 13-valent conjugate (PCV13) vaccine. One dose is recommended after age 17.  Pneumococcal polysaccharide (PPSV23) vaccine. One dose is recommended after age 80. Talk to your health care provider about which screenings and vaccines you need and how often you need them. This information is not intended to replace advice given to you by your health care provider. Make sure you discuss any questions you have with your health care provider. Document Released: 02/06/2015 Document Revised: 09/30/2015 Document Reviewed: 11/11/2014 Elsevier Interactive Patient Education  2017 [employer] Prevention in the Home Falls can cause injuries. They can happen to people of all ages. There are many things you can do to make your home safe and to help prevent falls. What can I do on the outside of my home?  Regularly fix the edges of walkways and driveways and fix any cracks.  Remove anything that might make you trip as you walk through a door, such as a raised step or threshold.  Trim any bushes or trees on the path to your home.  Use bright outdoor lighting.  Clear any walking paths of anything that might make someone trip, such as rocks or tools.  Regularly check to see if handrails are loose or broken. Make sure that both sides of any steps have handrails.  Any raised decks and porches should have guardrails on the edges.  Have any leaves, snow, or ice cleared regularly.  Use sand or salt on walking paths during winter.  Clean up any spills in your garage right away. This includes oil or grease spills. What can I do in the bathroom?  Use night lights.  Install grab bars by the toilet and in the tub and shower. Do not use towel bars as grab bars.  Use non-skid mats or decals in the tub or shower.  If you need to sit down in the shower, use a plastic,  non-slip stool.  Keep the floor dry. Clean up any water that spills on the floor as soon as it happens.  Remove soap buildup in the tub or shower regularly.  Attach bath mats securely with double-sided non-slip rug tape.  Do not have throw rugs and other things on the floor that can make you trip. What can I do in the bedroom?  Use night lights.  Make sure that you have a light by your bed that is easy to reach.  Do not use any sheets or blankets that are too big for your bed. They should not hang down onto the floor.  Have a firm chair that has side arms. You can use this for support while you get dressed.  Do not have throw rugs and other things on the floor that can make you trip. What can I do in the kitchen?  Clean up any spills right away.  Avoid walking on wet floors.  Keep items that you use a lot in easy-to-reach places.  If you need to reach something above you, use a strong step stool that has a grab bar.  Keep electrical cords out  of the way.  Do not use floor polish or wax that makes floors slippery. If you must use wax, use non-skid floor wax.  Do not have throw rugs and other things on the floor that can make you trip. What can I do with my stairs?  Do not leave any items on the stairs.  Make sure that there are handrails on both sides of the stairs and use them. Fix handrails that are broken or loose. Make sure that handrails are as long as the stairways.  Check any carpeting to make sure that it is firmly attached to the stairs. Fix any carpet that is loose or worn.  Avoid having throw rugs at the top or bottom of the stairs. If you do have throw rugs, attach them to the floor with carpet tape.  Make sure that you have a light switch at the top of the stairs and the bottom of the stairs. If you do not have them, ask someone to add them for you. What else can I do to help prevent falls?  Wear shoes that:  Do not have high heels.  Have rubber  bottoms.  Are comfortable and fit you well.  Are closed at the toe. Do not wear sandals.  If you use a stepladder:  Make sure that it is fully opened. Do not climb a closed stepladder.  Make sure that both sides of the stepladder are locked into place.  Ask someone to hold it for you, if possible.  Clearly mark and make sure that you can see:  Any grab bars or handrails.  First and last steps.  Where the edge of each step is.  Use tools that help you move around (mobility aids) if they are needed. These include:  Canes.  Walkers.  Scooters.  Crutches.  Turn on the lights when you go into a dark area. Replace any light bulbs as soon as they burn out.  Set up your furniture so you have a clear path. Avoid moving your furniture around.  If any of your floors are uneven, fix them.  If there are any pets around you, be aware of where they are.  Review your medicines with your doctor. Some medicines can make you feel dizzy. This can increase your chance of falling. Ask your doctor what other things that you can do to help prevent falls. This information is not intended to replace advice given to you by your health care provider. Make sure you discuss any questions you have with your health care provider. Document Released: 11/06/2008 Document Revised: 06/18/2015 Document Reviewed: 02/14/2014 Elsevier Interactive Patient Education  2017 [employer].

## 2018-10-23 ENCOUNTER — Ambulatory Visit (INDEPENDENT_AMBULATORY_CARE_PROVIDER_SITE_OTHER): Payer: Medicare HMO

## 2018-10-23 ENCOUNTER — Other Ambulatory Visit: Payer: Self-pay

## 2018-10-23 DIAGNOSIS — Z23 Encounter for immunization: Secondary | ICD-10-CM

## 2018-12-18 ENCOUNTER — Ambulatory Visit: Payer: Medicare HMO | Admitting: Family Medicine

## 2019-01-10 ENCOUNTER — Encounter: Payer: Self-pay | Admitting: Adult Health

## 2019-01-10 ENCOUNTER — Ambulatory Visit (INDEPENDENT_AMBULATORY_CARE_PROVIDER_SITE_OTHER): Payer: Medicare HMO | Admitting: Adult Health

## 2019-01-10 ENCOUNTER — Other Ambulatory Visit: Payer: Self-pay

## 2019-01-10 VITALS — BP 126/82 | HR 95 | Temp 97.1°F | Resp 16 | Wt 106.2 lb

## 2019-01-10 DIAGNOSIS — J029 Acute pharyngitis, unspecified: Secondary | ICD-10-CM | POA: Diagnosis not present

## 2019-01-10 DIAGNOSIS — R234 Changes in skin texture: Secondary | ICD-10-CM | POA: Diagnosis not present

## 2019-01-10 MED ORDER — AMOXICILLIN 875 MG PO TABS: 875.0000 mg | ORAL_TABLET | Freq: Two times a day (BID) | ORAL | 0 refills | Status: DC

## 2019-01-10 NOTE — Progress Notes (Addendum)
Patient: Suzanne Morgan Female    DOB: 24-Dec-1940   78 y.o.   MRN: [id number] Visit Date: 01/10/2019  Today's Provider: Marcille Buffy, FNP   Chief Complaint  Patient presents with  . Oral Pain   Subjective:     HPI  Patient comes in office today to address a "spot" she found in the back of her throat. Patient states that "spot" has been present for the past 6 weeks and denies pain when swallowing or dysphagia.  She reports it as a mild discomfort when she swallows. She reports " I can not see the area:" I feel it " my throat is very sore" . Throat has been sore for over 6 weeks, Denies allergies or drainage.  Denies smoking.  Denies any choking.  Denies cough or loss of smell/ taste.  She denies having this ever before. She has tried salt water gargles. No improvements. No history of smoking. She sees dentist regularly.  Patient  denies any fever, body aches,chills, rash, chest pain, shortness of breath, nausea, vomiting, or diarrhea.   Right lower leg also with a spot that is changing.She reports sees [facility] and had an area removed in past from same location she thinks it was some type of skin cancer.-   Allergies  Allergen Reactions  . Clindamycin/Lincomycin Diarrhea  . Sulfa Antibiotics   . Other Rash    KAPIDEX      Current Outpatient Medications:  .  amLODipine (NORVASC) 2.5 MG tablet, Take 1 tablet (2.5 mg total) by mouth daily., Disp: 30 tablet, Rfl: 11 .  calcium carbonate (OS-CAL) 600 MG TABS tablet, Take 600 mg by mouth daily with breakfast. , Disp: , Rfl:  .  levothyroxine (SYNTHROID, LEVOTHROID) 100 MCG tablet, TAKE 1 TABLET BY MOUTH EVERY DAY, Disp: 90 tablet, Rfl: 4 .  Multiple Vitamin tablet, Take 1 tablet by mouth daily. , Disp: , Rfl:   Review of Systems  Constitutional: Negative.   HENT: Positive for rhinorrhea (none today ) and sore throat (x 6 months ). Negative for congestion, dental problem, drooling, ear discharge,  ear pain, facial swelling, hearing loss, mouth sores, nosebleeds, postnasal drip, sinus pressure, sinus pain, sneezing, tinnitus, trouble swallowing and voice change.   Respiratory: Negative.   Cardiovascular: Negative.   Gastrointestinal: Negative.   Genitourinary: Negative.   Musculoskeletal: Negative.   Skin: Positive for color change (old skin cancer removal site [facility] removed years ago feels it is returning. ).  Allergic/Immunologic: Positive for environmental allergies.    Social History   Tobacco Use  . Smoking status: Never Smoker  . Smokeless tobacco: Never Used  Substance Use Topics  . Alcohol use: Yes    Alcohol/week: 1.0 - 2.0 standard drinks    Types: 1 - 2 Glasses of wine per week      Objective:   BP 126/82   Pulse 95   Temp (!) 97.1 F (36.2 C) (Oral)   Resp 16   Wt 106 lb 3.2 oz (48.2 kg)   SpO2 95%   BMI 20.07 kg/m  Vitals:   01/10/19 1114  BP: 126/82  Pulse: 95  Resp: 16  Temp: (!) 97.1 F (36.2 C)  TempSrc: Oral  SpO2: 95%  Weight: 106 lb 3.2 oz (48.2 kg)  Body mass index is 20.07 kg/m.   Physical Exam Vitals reviewed.  Constitutional:      Appearance: Normal appearance.  HENT:     Head:  Normocephalic and atraumatic.     Right Ear: Tympanic membrane, ear canal and external ear normal.     Left Ear: Tympanic membrane, ear canal and external ear normal.     Nose: Nose normal. No congestion or rhinorrhea.     Mouth/Throat:     Mouth: Mucous membranes are moist.     Tongue: No lesions. Tongue does not deviate from midline.     Palate: No mass.     Pharynx: Oropharyngeal exudate (scant white ) and posterior oropharyngeal erythema (mild ) present.     Tonsils: No tonsillar exudate or tonsillar abscesses.     Comments: No spot visualized  "patient reports it is farther back in her throat "  No cobblestoning.  Eyes:     Extraocular Movements: Extraocular movements intact.     Conjunctiva/sclera: Conjunctivae normal.      Pupils: Pupils are equal, round, and reactive to light.  Neck:     Thyroid: No thyroid mass.     Vascular: No carotid bruit.     Trachea: Trachea and phonation normal. No tracheal tenderness.  Cardiovascular:     Rate and Rhythm: Normal rate and regular rhythm.     Pulses: Normal pulses.     Heart sounds: Normal heart sounds.  Pulmonary:     Effort: Pulmonary effort is normal.     Breath sounds: Normal breath sounds.  Abdominal:     General: Abdomen is flat. There is no distension.     Palpations: Abdomen is soft. There is no mass.     Tenderness: There is no abdominal tenderness.  Musculoskeletal:        General: Normal range of motion.     Cervical back: Normal range of motion and neck supple. No rigidity or tenderness.  Lymphadenopathy:     Cervical: Cervical adenopathy present.     Right cervical: Superficial cervical adenopathy (shotty very small soft mobile lymph node palpated ) present. No deep or posterior cervical adenopathy.    Left cervical: No superficial, deep or posterior cervical adenopathy.  Skin:    General: Skin is warm.     Capillary Refill: Capillary refill takes less than 2 seconds.     Findings: Lesion (right lower lateral calf with old dermatological surgical scar and has rough raised skin growing within in. white/ yellow in appearance. no bleeding. ) present.  Neurological:     General: No focal deficit present.     Mental Status: She is alert and oriented to person, place, and time. Mental status is at baseline.  Psychiatric:        Mood and Affect: Mood normal.        Behavior: Behavior normal.        Thought Content: Thought content normal.        Judgment: Judgment normal.      No results found for any visits on 01/10/19.     Assessment & Plan    1. Sore throat Unable to see any spot the patient is concerned with. Will have her see ENT for further evaluation given 6 months duration.  - Ambulatory referral to ENT She should receive a call  within 1-2 weeks. Call if not heard.   Will treat for any bacteria. - Amoxicillin to see if any improvement.   Follow up with ENT. Mild lymphadenopathy as documented.  2. Skin texture changes- right calf  Call Doyl[facility] Canard for follow up - at old skin biopsy site new growth, appearence of actinic keratosis  however needs biopsy.  She is a current patient at this dermatologist and agrees to call to schedule within ine week.    Do not suspect covid with lack of any other symptoms and duration.  Advised patient call the office or your primary care doctor for an appointment if no improvement within 72 hours or if any symptoms change or worsen at any time  Advised ER or urgent Care if after hours or on weekend. Call 911 for emergency symptoms at any time.Patinet verbalized understanding of all instructions given/reviewed and treatment plan and has no further questions or concerns at this time.        The entirety of the information documented in the History of Present Illness, Review of Systems and Physical Exam were personally obtained by me. Portions of this information were initially documented by the  Certified Medical Assistant whose name is documented in [place] and reviewed by me for thoroughness and accuracy.  I have personally performed the exam and reviewed the chart and it is accurate to the best of my knowledge.  Haematologist has been used and any errors in dictation or transcription are unintentional.  Kelby Aline. [place], [facility]

## 2019-01-10 NOTE — Patient Instructions (Signed)
Amoxicillin capsules or tablets What is this medicine? AMOXICILLIN (a mox i SIL in) is a penicillin antibiotic. It is used to treat certain kinds of bacterial infections. It will not work for colds, flu, or other viral infections. This medicine may be used for other purposes; ask your health care provider or pharmacist if you have questions. COMMON BRAND NAME(S): Amoxil, Moxilin, Sumox, Trimox What should I tell my health care provider before I take this medicine? They need to know if you have any of these conditions:  kidney disease  an unusual or allergic reaction to amoxicillin, other penicillins, cephalosporin antibiotics, other medicines, foods, dyes, or preservatives  pregnant or trying to get pregnant  breast-feeding How should I use this medicine? Take this medicine by mouth with a glass of water. Follow the directions on your prescription label. You can take it with or without food. If it upsets your stomach, take it with food. Take your medicine at regular intervals. Do not take your medicine more often than directed. Take all of your medicine as directed even if you think you are better. Do not skip doses or stop your medicine early. Talk to your pediatrician regarding the use of this medicine in children. While this drug may be prescribed for selected conditions, precautions do apply. Overdosage: If you think you have taken too much of this medicine contact a poison control center or emergency room at once. NOTE: This medicine is only for you. Do not share this medicine with others. What if I miss a dose? If you miss a dose, take it as soon as you can. If it is almost time for your next dose, take only that dose. Do not take double or extra doses. What may interact with this medicine?  allopurinol  birth control pills  certain antibiotics like chloramphenicol, erythromycin, sulfamethoxazole, tetracycline  certain medicines that treat or prevent blood clots like  warfarin This list may not describe all possible interactions. Give your health care provider a list of all the medicines, herbs, non-prescription drugs, or dietary supplements you use. Also tell them if you smoke, drink alcohol, or use illegal drugs. Some items may interact with your medicine. What should I watch for while using this medicine? Tell your health care professional if your symptoms do not start to get better or if they get worse. Do not treat diarrhea with over the counter products. Contact your health care professional if you have diarrhea that lasts more than 2 days or if it is severe and watery. If you have diabetes, you may get a false-positive result for sugar in your urine. Check with your health care professional. Birth control may not work properly while you are taking this medicine. Talk to your health care professional about using an extra method of birth control. This medicine may cause serious skin reactions. They can happen weeks to months after starting the medicine. Contact your health care provider right away if you notice fevers or flu-like symptoms with a rash. The rash may be red or purple and then turn into blisters or peeling of the skin. Or, you might notice a red rash with swelling of the face, lips or lymph nodes in your neck or under your arms. What side effects may I notice from receiving this medicine? Side effects that you should report to your doctor or health care professional as soon as possible:  allergic reactions like skin rash, itching or hives, swelling of the face, lips, or tongue  bloody or watery  diarrhea  breathing problems  feeling faint; lightheaded, falls  fever  redness, blistering, peeling or loosening of the skin, including inside the mouth  seizures  signs and symptoms of kidney injury like trouble passing urine or change in the amount of urine  signs and symptoms of liver injury like dark yellow or brown urine; general ill  feeling or flu-like symptoms; light-colored stools; loss of appetite; nausea; right upper belly pain; unusually weak or tired; yellowing of the eyes or skin  unusual bleeding or bruising  unusually weak or tired Side effects that usually do not require medical attention (report to your doctor or health care professional if they continue or are bothersome):  anxious  confusion  diarrhea  dizziness  headache  nausea, vomiting  stomach upset  trouble sleeping This list may not describe all possible side effects. Call your doctor for medical advice about side effects. You may report side effects to FDA at 1-800-FDA-1088. Where should I keep my medicine? Keep out of the reach of children. Store at room temperature between 20 and 25 degrees C (68 and 77 degrees F). Throw away any unused medicine after the expiration date. NOTE: This sheet is a summary. It may not cover all possible information. If you have questions about this medicine, talk to your doctor, pharmacist, or health care provider.  2020 Elsevier/Gold Standard (2018-03-23 14:32:29) Sore Throat When you have a sore throat, your throat may feel:  Tender.  Burning.  Irritated.  Scratchy.  Painful when you swallow.  Painful when you talk. Many things can cause a sore throat, such as:  An infection.  Allergies.  Dry air.  Smoke or pollution.  Radiation treatment.  Gastroesophageal reflux disease (GERD).  A tumor. A sore throat can be the first sign of another sickness. It can happen with other problems, like:  Coughing.  Sneezing.  Fever.  Swelling in the neck. Most sore throats go away without treatment. Follow these instructions at home:      Take over-the-counter medicines only as told by your doctor. ? If your child has a sore throat, do not give your child aspirin.  Drink enough fluids to keep your pee (urine) pale yellow.  Rest when you feel you need to.  To help with pain: ? Sip  warm liquids, such as broth, herbal tea, or warm water. ? Eat or drink cold or frozen liquids, such as frozen ice pops. ? Gargle with a salt-water mixture 3-4 times a day or as needed. To make a salt-water mixture, add -1 tsp (3-6 g) of salt to 1 cup (237 mL) of warm water. Mix it until you cannot see the salt anymore. ? Suck on hard candy or throat lozenges. ? Put a cool-mist humidifier in your bedroom at night. ? Sit in the bathroom with the door closed for 5-10 minutes while you run hot water in the shower.  Do not use any products that contain nicotine or tobacco, such as cigarettes, e-cigarettes, and chewing tobacco. If you need help quitting, ask your doctor.  Wash your hands well and often with soap and water. If soap and water are not available, use hand sanitizer. Contact a doctor if:  You have a fever for more than 2-3 days.  You keep having symptoms for more than 2-3 days.  Your throat does not get better in 7 days.  You have a fever and your symptoms suddenly get worse.  Your child who is 3 months to 76 years old has  a temperature of 102.65F (39C) or higher. Get help right away if:  You have trouble breathing.  You cannot swallow fluids, soft foods, or your saliva.  You have swelling in your throat or neck that gets worse.  You keep feeling sick to your stomach (nauseous).  You keep throwing up (vomiting). Summary  A sore throat is pain, burning, irritation, or scratchiness in the throat. Many things can cause a sore throat.  Take over-the-counter medicines only as told by your doctor. Do not give your child aspirin.  Drink plenty of fluids, and rest as needed.  Contact a doctor if your symptoms get worse or your sore throat does not get better within 7 days. This information is not intended to replace advice given to you by your health care provider. Make sure you discuss any questions you have with your health care provider. Document Released: 10/20/2007  Document Revised: 06/12/2017 Document Reviewed: 06/12/2017 Elsevier Patient Education  2020 [employer].

## 2019-01-14 ENCOUNTER — Telehealth: Payer: Self-pay | Admitting: Family Medicine

## 2019-01-14 NOTE — Telephone Encounter (Signed)
Pt wants to know if she can have a throat culture done.  States she is on amoxicillin (AMOXIL) 875 MG tablet and wants to know if she needs to continue taking it.

## 2019-01-14 NOTE — Telephone Encounter (Signed)
From [facility] 

## 2019-01-15 NOTE — Telephone Encounter (Signed)
You can't really do a throat culture while taking an antibiotic, the antibiotic will prevent anything from growing in the culture medium.

## 2019-01-15 NOTE — Telephone Encounter (Signed)
Patient advised.

## 2019-01-22 ENCOUNTER — Telehealth: Payer: Self-pay | Admitting: Family Medicine

## 2019-01-22 NOTE — Telephone Encounter (Signed)
From [facility] 

## 2019-01-22 NOTE — Telephone Encounter (Signed)
Pt saw michelle on 01-10-2019 and was prescribed an abx. Pt has completed abx.  Pt has history of cdiff. Pt is having diarrhea and her daughter Lenna Sciara would like her mother to have abx vacomycin the medication seems to relief her diarrhea. Pt is out of town [employer] [place] [phone] phone [phone]

## 2019-01-22 NOTE — Telephone Encounter (Signed)
Spoke with daughter on the phone and advised below, she seemed to be very upset at the decision to send patient to urgent care facility, she states that the antibiotic patient was on should have not been prescribed because of patients history of C. Diff, daughter states that she knows patient  has c.diff currently and was upset that we would not see patient. I advised her of Covid protocol and suggested that patient be seen at urgent care facility if she is out of town to be evaluated for G.I symptoms. Daughter states that patient will no longer be coming back to our practice for treatment. KW

## 2019-01-22 NOTE — Telephone Encounter (Signed)
Yes patient needs to be seen at urgent care/ ER for evaluation. Will not call antibiotics in. Virtual visit not appropriate. Covid office protocol unable to see in this office with current symptoms. Needs evaluation.

## 2019-01-22 NOTE — Telephone Encounter (Signed)
Please advise if patient needs to go to a urgent care facility? Patient was seen in our office with complaints of sore throat/ pain in back of throat on 12/17. Please review. KW

## 2019-01-23 ENCOUNTER — Telehealth: Payer: Self-pay | Admitting: Emergency Medicine

## 2019-01-23 ENCOUNTER — Ambulatory Visit
Admission: EM | Admit: 2019-01-23 | Discharge: 2019-01-23 | Disposition: A | Discharge status: Home / Self Care | Payer: Medicare HMO | Attending: Family Medicine | Admitting: Family Medicine

## 2019-01-23 ENCOUNTER — Encounter: Payer: Self-pay | Admitting: Emergency Medicine

## 2019-01-23 ENCOUNTER — Other Ambulatory Visit: Payer: Self-pay

## 2019-01-23 DIAGNOSIS — R197 Diarrhea, unspecified: Secondary | ICD-10-CM | POA: Diagnosis not present

## 2019-01-23 LAB — C DIFFICILE QUICK SCREEN W PCR REFLEX
C Diff antigen: NEGATIVE
C Diff interpretation: NOT DETECTED
C Diff toxin: NEGATIVE

## 2019-01-23 NOTE — Telephone Encounter (Signed)
Patient notified of negative C-diff results per Dr. Lacinda Axon. She was advised to take Imodium for her diarrhea. Patient verbalized understanding and had no further questions or concerns.

## 2019-01-23 NOTE — Discharge Instructions (Signed)
Return with test and we will call with the results.  Take care  Dr. Lacinda Axon

## 2019-01-23 NOTE — ED Provider Notes (Signed)
[facility]    CSN: [id number] Arrival date & time: 01/23/19  1204      History   Chief Complaint Chief Complaint  Patient presents with  . Diarrhea   HPI 78 year old female with a history of C. difficile presents with diarrhea.  Patient reports that she recently completed a course of amoxicillin.  She states that approximately 3 to 4 days ago she developed diarrhea.  She states that her stools seem to be mucousy.  She is concerned that she has developed C. difficile again.  Last bowel movement was earlier this morning.  She has had no additional loose stools today.  No fevers or chills.  No abdominal pain.  No relieving factors.  No other complaints.  PMH, Surgical Hx, Family Hx, Social History reviewed and updated as below.  Past Medical History:  Diagnosis Date  . Herpes zoster without complication 25/75/0518  . History of chicken pox   . History of mumps   . Hypertension   . Hypothyroidism   . Mitral valve prolapse   . Squamous cell carcinoma     Patient Active Problem List   Diagnosis Date Noted  . Special screening for malignant neoplasms, colon   . Benign neoplasm of ascending colon   . Hypertrophy of anal papillae   . History of Clostridium difficile colitis 06/13/2017  . 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 Abnormal LFTs 03/05/2015  . Osteopenia 12/05/2007  . Esophagitis, reflux 03/20/2007  . Hypothyroidism, postop 01/24/1973    Past Surgical History:  Procedure Laterality Date  . BREAST CYST ASPIRATION Right many yrs ago   negative  . COLONOSCOPY WITH PROPOFOL N/A 11/30/2017   Procedure: COLONOSCOPY WITH PROPOFOL;  Surgeon: Virgel Manifold, MD;  Location: [facility];  Service: Endoscopy;  Laterality: N/A;  . THYROIDECTOMY    . UPPER GI ENDOSCOPY  06/13/07   HIATAL HERNIA, IRREGEULAR Z-LINE    OB History   No obstetric history on file.      Home Medications    Prior to Admission medications   Medication Sig Start Date End  Date Taking? Authorizing Provider  amLODipine (NORVASC) 2.5 MG tablet Take 1 tablet (2.5 mg total) by mouth daily. 06/26/18  Yes Birdie Sons, MD  calcium carbonate (OS-CAL) 600 MG TABS tablet Take 600 mg by mouth daily with breakfast.  12/26/06  Yes [provider]  levothyroxine (SYNTHROID, LEVOTHROID) 100 MCG tablet TAKE 1 TABLET BY MOUTH EVERY DAY 02/28/18  Yes Birdie Sons, MD  Multiple Vitamin tablet Take 1 tablet by mouth daily.  12/13/05  Yes [provider]  amoxicillin (AMOXIL) 875 MG tablet Take 1 tablet (875 mg total) by mouth 2 (two) times daily. 01/10/19   Flinchum, Kelby Aline, FNP    Family History Family History  Problem Relation Age of Onset  . Stroke Mother   . Heart attack Father     Social History Social History   Tobacco Use  . Smoking status: Never Smoker  . Smokeless tobacco: Never Used  Substance Use Topics  . Alcohol use: Yes    Alcohol/week: 1.0 - 2.0 standard drinks    Types: 1 - 2 Glasses of wine per week  . Drug use: No     Allergies   Clindamycin/lincomycin, Sulfa antibiotics, and Other   Review of Systems Review of Systems  Constitutional: Negative.   Gastrointestinal: Positive for diarrhea.   Physical Exam Triage Vital Signs ED Triage Vitals  Enc Vitals Group  BP 01/23/19 1227 139/69     Pulse Rate 01/23/19 1227 96     Resp 01/23/19 1227 18     Temp 01/23/19 1227 98.1 F (36.7 C)     Temp Source 01/23/19 1227 Oral     SpO2 01/23/19 1227 100 %     Weight 01/23/19 1223 106 lb (48.1 kg)     Height 01/23/19 1223 5' 1"  (1.549 m)     Head Circumference --      Peak Flow --      Pain Score 01/23/19 1223 0     Pain Loc --      Pain Edu? --      Excl. in [place]? --    Updated Vital Signs BP 139/69 (BP Location: Left Arm)   Pulse 96   Temp 98.1 F (36.7 C) (Oral)   Resp 18   Ht 5' 1"  (1.549 m)   Wt 48.1 kg   SpO2 100%   BMI 20.03 kg/m   Visual Acuity Right Eye Distance:   Left Eye Distance:   Bilateral  Distance:    Right Eye Near:   Left Eye Near:    Bilateral Near:     Physical Exam Vitals and nursing note reviewed.  Constitutional:      General: She is not in acute distress.    Appearance: Normal appearance. She is not ill-appearing.  HENT:     Head: Normocephalic and atraumatic.  Eyes:     General:        Right eye: No discharge.        Left eye: No discharge.     Conjunctiva/sclera: Conjunctivae normal.  Cardiovascular:     Rate and Rhythm: Normal rate and regular rhythm.  Pulmonary:     Effort: Pulmonary effort is normal.     Breath sounds: Normal breath sounds. No wheezing, rhonchi or rales.  Skin:    General: Skin is warm.     Findings: No rash.  Neurological:     Mental Status: She is alert.  Psychiatric:        Mood and Affect: Mood normal.        Behavior: Behavior normal.    UC Treatments / Results  Labs (all labs ordered are listed, but only abnormal results are displayed) Labs Reviewed  C DIFFICILE QUICK SCREEN W PCR REFLEX    EKG   Radiology No results found.  Procedures Procedures (including critical care time)  Medications Ordered in UC Medications - No data to display  Initial Impression / Assessment and Plan / UC Course  I have reviewed the triage vital signs and the nursing notes.  Pertinent labs & imaging results that were available during my care of the patient were reviewed by me and considered in my medical decision making (see chart for details).    77 year old female presents with diarrhea.  Patient sent home with C. difficile kit and returned with a sample later in the day.  Testing negative for C. difficile.  Advised supportive care and Imodium if desired.  Final Clinical Impressions(s) / UC Diagnoses   Final diagnoses:  Diarrhea of presumed infectious origin     Discharge Instructions     Return with test and we will call with the results.  Take care  Dr. Lacinda Axon    ED Prescriptions    None     PDMP not  reviewed this encounter.   Coral Spikes, [place] 01/23/19 1614

## 2019-01-23 NOTE — ED Triage Notes (Signed)
Pt c/o diarrhea. Started about 3 days ago. she states that she took amoxicillin. She has had Cdiff in the past. She states her stools are "mucusy" and yellow.she states that her stools are different than they were when she had c diff before. [place] no had to use the bathroom.

## 2019-02-11 ENCOUNTER — Other Ambulatory Visit (HOSPITAL_COMMUNITY): Payer: Self-pay | Admitting: Otolaryngology

## 2019-02-11 ENCOUNTER — Other Ambulatory Visit: Payer: Self-pay | Admitting: Otolaryngology

## 2019-02-11 DIAGNOSIS — R221 Localized swelling, mass and lump, neck: Secondary | ICD-10-CM | POA: Diagnosis not present

## 2019-02-11 DIAGNOSIS — J301 Allergic rhinitis due to pollen: Secondary | ICD-10-CM | POA: Diagnosis not present

## 2019-02-11 DIAGNOSIS — R682 Dry mouth, unspecified: Secondary | ICD-10-CM | POA: Diagnosis not present

## 2019-02-11 DIAGNOSIS — R1312 Dysphagia, oropharyngeal phase: Secondary | ICD-10-CM

## 2019-02-11 DIAGNOSIS — K120 Recurrent oral aphthae: Secondary | ICD-10-CM | POA: Diagnosis not present

## 2019-02-11 DIAGNOSIS — R070 Pain in throat: Secondary | ICD-10-CM | POA: Diagnosis not present

## 2019-02-22 ENCOUNTER — Ambulatory Visit: Payer: Medicare HMO

## 2019-02-27 ENCOUNTER — Encounter: Payer: Self-pay | Admitting: Physician Assistant

## 2019-02-27 ENCOUNTER — Other Ambulatory Visit: Payer: Self-pay

## 2019-02-27 ENCOUNTER — Ambulatory Visit (INDEPENDENT_AMBULATORY_CARE_PROVIDER_SITE_OTHER): Payer: Medicare HMO | Admitting: Physician Assistant

## 2019-02-27 VITALS — BP 130/70 | Temp 96.6°F | Wt 103.0 lb

## 2019-02-27 DIAGNOSIS — I10 Essential (primary) hypertension: Secondary | ICD-10-CM

## 2019-02-27 DIAGNOSIS — E890 Postprocedural hypothyroidism: Secondary | ICD-10-CM | POA: Diagnosis not present

## 2019-02-27 DIAGNOSIS — Z1231 Encounter for screening mammogram for malignant neoplasm of breast: Secondary | ICD-10-CM | POA: Diagnosis not present

## 2019-02-27 NOTE — Progress Notes (Signed)
       Patient: Suzanne Morgan Female    DOB: [birth year]   79 y.o.   MRN: [id number] Visit Date: 02/27/2019  Today's Provider: Trinna Post, PA-C   Chief Complaint  Patient presents with  . Follow-up   Subjective:     HPI Follow-up Patient states that last night her blood pressure was elevated to 191/101 and she called [facility]. Patient states that it was still elevated when the EMS worker checked it. Patient states that later that night her blood pressure went down to 125/66. She felt poorly and thus was checking her blood pressure. She denies passing out, chest pain, vision change, nausea vomiting. She currently takes amlodipine 2.5 mg daily and has a history of mitral valve prolapse.   Wt Readings from Last 3 Encounters:  02/27/19 103 lb (46.7 kg)  01/23/19 106 lb (48.1 kg)  01/10/19 106 lb 3.2 oz (48.2 kg)      Allergies  Allergen Reactions  . Clindamycin/Lincomycin Diarrhea  . Sulfa Antibiotics   . Other Rash    KAPIDEX      Current Outpatient Medications:  .  amLODipine (NORVASC) 2.5 MG tablet, Take 1 tablet (2.5 mg total) by mouth daily., Disp: 30 tablet, Rfl: 11 .  calcium carbonate (OS-CAL) 600 MG TABS tablet, Take 600 mg by mouth daily with breakfast. , Disp: , Rfl:  .  levothyroxine (SYNTHROID, LEVOTHROID) 100 MCG tablet, TAKE 1 TABLET BY MOUTH EVERY DAY, Disp: 90 tablet, Rfl: 4 .  Multiple Vitamin tablet, Take 1 tablet by mouth daily. , Disp: , Rfl:  .  amoxicillin (AMOXIL) 875 MG tablet, Take 1 tablet (875 mg total) by mouth 2 (two) times daily. (Patient not taking: Reported on 02/27/2019), Disp: 20 tablet, Rfl: 0  Review of Systems  Social History   Tobacco Use  . Smoking status: Never Smoker  . Smokeless tobacco: Never Used  Substance Use Topics  . Alcohol use: Yes    Alcohol/week: 1.0 - 2.0 standard drinks    Types: 1 - 2 Glasses of wine per week      Objective:   There were no vitals taken for this visit. There were no vitals filed for  this visit.There is no height or weight on file to calculate BMI.   Physical Exam Constitutional:      Appearance: Normal appearance.  Cardiovascular:     Rate and Rhythm: Normal rate and regular rhythm.     Heart sounds: Murmur present. Systolic murmur present with a grade of 2/6.  Skin:    General: Skin is warm and dry.  Neurological:     Mental Status: She is alert and oriented to person, place, and time. Mental status is at baseline.  Psychiatric:        Mood and Affect: Mood normal.        Behavior: Behavior normal.      No results found for any visits on 02/27/19.     Assessment & Plan    1. Hypothyroidism, postop   2. Essential hypertension  EKG comparable to prior. Blood pressure normotensive today. Exam reassuring. Continue to take amlodipine 2.5 mg daily.   - Comprehensive Metabolic Panel (CMET) - CBC with Differential - TSH - EKG 12-Lead  3. Encounter for screening mammogram for malignant neoplasm of breast  - MM Digital Screening; Future      Trinna Post, PA-C  [facility]

## 2019-02-27 NOTE — Patient Instructions (Signed)

## 2019-02-28 LAB — COMPREHENSIVE METABOLIC PANEL
ALT: 19 IU/L (ref 0–32)
AST: 30 IU/L (ref 0–40)
Albumin/Globulin Ratio: 2 (ref 1.2–2.2)
Albumin: 4.9 g/dL — ABNORMAL HIGH (ref 3.7–4.7)
Alkaline Phosphatase: 85 IU/L (ref 39–117)
BUN/Creatinine Ratio: 14 (ref 12–28)
BUN: 14 mg/dL (ref 8–27)
Bilirubin Total: 0.7 mg/dL (ref 0.0–1.2)
CO2: 24 mmol/L (ref 20–29)
Calcium: 9.8 mg/dL (ref 8.7–10.3)
Chloride: 100 mmol/L (ref 96–106)
Creatinine, Ser: 0.97 mg/dL (ref 0.57–1.00)
GFR calc Af Amer: 64 mL/min/{1.73_m2} (ref >59)
GFR calc non Af Amer: 56 mL/min/{1.73_m2} — ABNORMAL LOW (ref >59)
Globulin, Total: 2.5 g/dL (ref 1.5–4.5)
Glucose: 78 mg/dL (ref 65–99)
Potassium: 4.5 mmol/L (ref 3.5–5.2)
Sodium: 141 mmol/L (ref 134–144)
Total Protein: 7.4 g/dL (ref 6.0–8.5)

## 2019-02-28 LAB — CBC WITH DIFFERENTIAL/PLATELET
Basophils Absolute: 0.1 10*3/uL (ref 0.0–0.2)
Basos: 2 %
EOS (ABSOLUTE): 0.1 10*3/uL (ref 0.0–0.4)
Eos: 1 %
Hematocrit: 45.5 % (ref 34.0–46.6)
Hemoglobin: 15.4 g/dL (ref 11.1–15.9)
Immature Grans (Abs): 0 10*3/uL (ref 0.0–0.1)
Immature Granulocytes: 0 %
Lymphocytes Absolute: 1.6 10*3/uL (ref 0.7–3.1)
Lymphs: 18 %
MCH: 30.3 pg (ref 26.6–33.0)
MCHC: 33.8 g/dL (ref 31.5–35.7)
MCV: 90 fL (ref 79–97)
Monocytes Absolute: 1 10*3/uL — ABNORMAL HIGH (ref 0.1–0.9)
Monocytes: 11 %
Neutrophils Absolute: 5.9 10*3/uL (ref 1.4–7.0)
Neutrophils: 68 %
Platelets: 162 10*3/uL (ref 150–450)
RBC: 5.08 x10E6/uL (ref 3.77–5.28)
RDW: 11.7 % (ref 11.7–15.4)
WBC: 8.6 10*3/uL (ref 3.4–10.8)

## 2019-02-28 LAB — TSH: TSH: 2.75 u[IU]/mL (ref 0.450–4.500)

## 2019-03-01 ENCOUNTER — Other Ambulatory Visit: Payer: Self-pay

## 2019-03-01 ENCOUNTER — Ambulatory Visit
Admission: RE | Admit: 2019-03-01 | Discharge: 2019-03-01 | Disposition: A | Discharge status: Home / Self Care | Payer: Medicare HMO | Source: Ambulatory Visit | Attending: Otolaryngology | Admitting: Otolaryngology

## 2019-03-01 DIAGNOSIS — R1312 Dysphagia, oropharyngeal phase: Secondary | ICD-10-CM | POA: Insufficient documentation

## 2019-03-01 MED ORDER — IOHEXOL 300 MG/ML  SOLN
75.0000 mL | Freq: Once | INTRAMUSCULAR | Status: AC | PRN
  Administered 2019-03-01: 75 mL via INTRAVENOUS

## 2019-03-12 DIAGNOSIS — Z859 Personal history of malignant neoplasm, unspecified: Secondary | ICD-10-CM | POA: Diagnosis not present

## 2019-03-12 DIAGNOSIS — D485 Neoplasm of uncertain behavior of skin: Secondary | ICD-10-CM | POA: Diagnosis not present

## 2019-03-12 DIAGNOSIS — L570 Actinic keratosis: Secondary | ICD-10-CM | POA: Diagnosis not present

## 2019-03-19 DIAGNOSIS — L0390 Cellulitis, unspecified: Secondary | ICD-10-CM | POA: Diagnosis not present

## 2019-03-27 ENCOUNTER — Other Ambulatory Visit: Payer: Self-pay | Admitting: Physician Assistant

## 2019-03-27 DIAGNOSIS — Z1231 Encounter for screening mammogram for malignant neoplasm of breast: Secondary | ICD-10-CM

## 2019-04-08 ENCOUNTER — Telehealth: Payer: Self-pay

## 2019-04-08 DIAGNOSIS — Z13820 Encounter for screening for osteoporosis: Secondary | ICD-10-CM

## 2019-04-08 NOTE — Telephone Encounter (Signed)
Copied from [place] [id number]. Topic: Referral - Request for Referral >> Apr 08, 2019  3:07 PM Sheran Luz wrote: Has patient seen PCP for this complaint? No *If NO, is insurance requiring patient see PCP for this issue before PCP can refer them? Referral for which specialty: [facility] Preferred provider/office: not specified  Reason for referral: bone density scan

## 2019-04-11 NOTE — Telephone Encounter (Signed)
done

## 2019-04-11 NOTE — Addendum Note (Signed)
Addended by: Birdie Sons on: 04/11/2019 02:44 PM   Modules accepted: Orders

## 2019-04-16 DIAGNOSIS — L570 Actinic keratosis: Secondary | ICD-10-CM | POA: Diagnosis not present

## 2019-04-17 DIAGNOSIS — H2513 Age-related nuclear cataract, bilateral: Secondary | ICD-10-CM | POA: Diagnosis not present

## 2019-04-29 ENCOUNTER — Ambulatory Visit
Admission: RE | Admit: 2019-04-29 | Discharge: 2019-04-29 | Disposition: A | Discharge status: Home / Self Care | Payer: Medicare HMO | Source: Ambulatory Visit | Attending: Family Medicine | Admitting: Family Medicine

## 2019-04-29 ENCOUNTER — Other Ambulatory Visit: Payer: Self-pay | Admitting: Family Medicine

## 2019-04-29 ENCOUNTER — Ambulatory Visit
Admission: RE | Admit: 2019-04-29 | Discharge: 2019-04-29 | Disposition: A | Discharge status: Home / Self Care | Payer: Medicare HMO | Source: Ambulatory Visit | Attending: Physician Assistant | Admitting: Physician Assistant

## 2019-04-29 DIAGNOSIS — Z13820 Encounter for screening for osteoporosis: Secondary | ICD-10-CM | POA: Diagnosis not present

## 2019-04-29 DIAGNOSIS — Z1231 Encounter for screening mammogram for malignant neoplasm of breast: Secondary | ICD-10-CM | POA: Diagnosis not present

## 2019-04-29 DIAGNOSIS — M85851 Other specified disorders of bone density and structure, right thigh: Secondary | ICD-10-CM | POA: Insufficient documentation

## 2019-05-02 ENCOUNTER — Telehealth: Payer: Self-pay

## 2019-05-02 NOTE — Telephone Encounter (Signed)
-----   Message from Birdie Sons, MD sent at 05/02/2019  9:05 AM EDT ----- BMD shows osteopenia in hip and spine. Overall stable since 2018.  Recheck every 3 years.

## 2019-05-02 NOTE — Telephone Encounter (Signed)
Patient advised.

## 2019-05-13 DIAGNOSIS — L570 Actinic keratosis: Secondary | ICD-10-CM | POA: Diagnosis not present

## 2019-05-14 DIAGNOSIS — R69 Illness, unspecified: Secondary | ICD-10-CM | POA: Diagnosis not present

## 2019-05-19 ENCOUNTER — Other Ambulatory Visit: Payer: Self-pay | Admitting: Family Medicine

## 2019-05-19 NOTE — Telephone Encounter (Signed)
Requested Prescriptions  Pending Prescriptions Disp Refills  . levothyroxine (SYNTHROID) 100 MCG tablet [Pharmacy Med Name: LEVOTHYROXINE 100 MCG TABLET] 90 tablet 4    Sig: TAKE 1 TABLET BY MOUTH EVERY DAY     Endocrinology:  Hypothyroid Agents Failed - 05/19/2019  9:53 AM      Failed - TSH needs to be rechecked within 3 months after an abnormal result. Refill until TSH is due.      Passed - TSH in normal range and within 360 days    TSH  Date Value Ref Range Status  02/27/2019 2.750 0.450 - 4.500 uIU/mL Final         Passed - Valid encounter within last 12 months    Recent Outpatient Visits          2 months ago Hypothyroidism, postop   [place] [place], [place]   4 months ago Sore throat   [facility], Kelby Aline, FNP   1 year ago Change in stool caliber   [facility] Birdie Sons, MD   1 year ago History of Clostridium difficile colitis   [facility] Birdie Sons, MD   1 year ago Colitis due to Clostridium difficile   [facility] Birdie Sons, MD

## 2019-06-05 ENCOUNTER — Other Ambulatory Visit: Payer: Self-pay | Admitting: Family Medicine

## 2019-06-05 DIAGNOSIS — I10 Essential (primary) hypertension: Secondary | ICD-10-CM

## 2019-06-11 DIAGNOSIS — D485 Neoplasm of uncertain behavior of skin: Secondary | ICD-10-CM | POA: Diagnosis not present

## 2019-06-11 DIAGNOSIS — C44722 Squamous cell carcinoma of skin of right lower limb, including hip: Secondary | ICD-10-CM | POA: Diagnosis not present

## 2019-06-25 DIAGNOSIS — C44722 Squamous cell carcinoma of skin of right lower limb, including hip: Secondary | ICD-10-CM | POA: Diagnosis not present

## 2019-07-02 DIAGNOSIS — C44722 Squamous cell carcinoma of skin of right lower limb, including hip: Secondary | ICD-10-CM | POA: Diagnosis not present

## 2019-08-02 DIAGNOSIS — Z859 Personal history of malignant neoplasm, unspecified: Secondary | ICD-10-CM | POA: Diagnosis not present

## 2019-08-02 DIAGNOSIS — R898 Other abnormal findings in specimens from other organs, systems and tissues: Secondary | ICD-10-CM | POA: Diagnosis not present

## 2019-08-19 ENCOUNTER — Telehealth: Payer: Self-pay

## 2019-08-19 NOTE — Telephone Encounter (Signed)
Next available CPE with Dr. Caryn Section is 10/25/2019. Please call patient to schedule CPE appointment.

## 2019-08-19 NOTE — Telephone Encounter (Signed)
Done.cbe 

## 2019-08-19 NOTE — Telephone Encounter (Signed)
Copied from [place] [id number]. Topic: General - Other >> Aug 19, 2019  9:21 AM Suzanne Morgan wrote: PT need an appt PHY sooner, no openings in till November  / please advise

## 2019-09-12 DIAGNOSIS — L570 Actinic keratosis: Secondary | ICD-10-CM | POA: Diagnosis not present

## 2019-09-19 DIAGNOSIS — L905 Scar conditions and fibrosis of skin: Secondary | ICD-10-CM | POA: Diagnosis not present

## 2019-10-09 DIAGNOSIS — R69 Illness, unspecified: Secondary | ICD-10-CM | POA: Diagnosis not present

## 2019-10-24 NOTE — Progress Notes (Signed)
I,Roshena L Chambers,acting as a scribe for Lelon Huh, MD.,have documented all relevant documentation on the behalf of Lelon Huh, MD,as directed by  Lelon Huh, MD while in the presence of Lelon Huh, MD.   Annual Wellness Visit     Patient: Suzanne Morgan, Female    DOB: 19-Feb-1940, 79 y.o.   MRN: [id number] Visit Date: 10/25/2019  Today's Provider: Lelon Huh, MD   Chief Complaint  Patient presents with  . Hypertension  . Hypothyroidism  . Medicare Wellness   Subjective    Suzanne Morgan is a 79 y.o. female who presents today for her Annual Wellness Visit. She reports consuming a general diet. Home exercise routine includes walking 4 times a week for 30 minutes at a time. She generally feels fairly well. She reports sleeping fairly well. She does have additional problems to discuss today.   HPI Hypertension, follow-up  BP Readings from Last 3 Encounters:  10/25/19 122/72  02/27/19 130/70  01/23/19 139/69   Wt Readings from Last 3 Encounters:  10/25/19 111 lb (50.3 kg)  02/27/19 103 lb (46.7 kg)  01/23/19 106 lb (48.1 kg)     She was last seen for hypertension 7 months ago.  BP at that visit was 130/70. Management since that visit includes continue same medication.  She reports good compliance with treatment. She is not having side effects.  She is following a Regular diet. She is exercising. She does not smoke.  Use of agents associated with hypertension: thyroid hormones.   Outside blood pressures are not checked. Symptoms: No chest pain No chest pressure  No palpitations No syncope  No dyspnea No orthopnea  No paroxysmal nocturnal dyspnea No lower extremity edema   Pertinent labs: Lab Results  Component Value Date   CHOL 221 (H) 05/18/2015   HDL 127 05/18/2015   LDLCALC 79 05/18/2015   TRIG 74 05/18/2015   CHOLHDL 1.7 05/18/2015   Lab Results  Component Value Date   NA 141 02/27/2019   K 4.5 02/27/2019   CREATININE 0.97  02/27/2019   GFRNONAA 56 (L) 02/27/2019   GFRAA 64 02/27/2019   GLUCOSE 78 02/27/2019     The ASCVD Risk score (Goff DC Jr., et al., 2013) failed to calculate for the following reasons:   The 2013 ASCVD risk score is only valid for ages 70 to 19   ---------------------------------------------------------------------------------------------------  Hypothyroid, follow-up  Lab Results  Component Value Date   TSH 2.750 02/27/2019   TSH 3.010 08/29/2018   TSH 1.770 04/10/2017   Wt Readings from Last 3 Encounters:  10/25/19 111 lb (50.3 kg)  02/27/19 103 lb (46.7 kg)  01/23/19 106 lb (48.1 kg)    She was last seen for hypothyroid 7 months ago.  Management since that visit includes continue same medication. She reports good compliance with treatment. She is not having side effects.   Symptoms: No change in energy level No constipation  No diarrhea No heat / cold intolerance  No nervousness No palpitations  No weight changes    -----------------------------------------------------------------------------------------     Medications: Outpatient Medications Prior to Visit  Medication Sig  . amLODipine (NORVASC) 2.5 MG tablet TAKE 1 TABLET BY MOUTH EVERY DAY  . calcium carbonate (OS-CAL) 600 MG TABS tablet Take 600 mg by mouth daily with breakfast.   . levothyroxine (SYNTHROID) 100 MCG tablet TAKE 1 TABLET BY MOUTH EVERY DAY  . Multiple Vitamin tablet Take 1 tablet by mouth daily.    No facility-administered  medications prior to visit.    Allergies  Allergen Reactions  . Clindamycin/Lincomycin Diarrhea  . Sulfa Antibiotics   . Other Rash    KAPIDEX     Patient Care Team: Birdie Sons, MD as PCP - General (Family Medicine) Pa, [place] as Consulting Physician (Optometry) Benitez-Graham, Cori Razor, MD (Dermatology) Virgel Manifold, MD as Consulting Physician (Gastroenterology)  Review of Systems  Constitutional: Negative for chills, fatigue and  fever.  HENT: Negative for congestion, ear pain, rhinorrhea, sneezing and sore throat.   Eyes: Positive for photophobia and visual disturbance. Negative for pain and redness.  Respiratory: Negative for cough, shortness of breath and wheezing.   Cardiovascular: Negative for chest pain and leg swelling.  Gastrointestinal: Negative for abdominal pain, blood in stool, constipation, diarrhea and nausea.  Endocrine: Negative for polydipsia and polyphagia.  Genitourinary: Negative.  Negative for dysuria, flank pain, hematuria, pelvic pain, vaginal bleeding and vaginal discharge.  Musculoskeletal: Positive for arthralgias (hips). Negative for back pain, gait problem and joint swelling.  Skin: Negative for rash.       Skin lesion on right lower leg  Neurological: Negative.  Negative for dizziness, tremors, seizures, weakness, light-headedness, numbness and headaches.  Hematological: Negative for adenopathy.  Psychiatric/Behavioral: Negative.  Negative for behavioral problems, confusion and dysphoric mood. The patient is not nervous/anxious and is not hyperactive.       Objective      Most recent functional status assessment: In your present state of health, do you have any difficulty performing the following activities: 10/25/2019  Hearing? N  Vision? Y  Difficulty concentrating or making decisions? N  Walking or climbing stairs? N  Dressing or bathing? N  Doing errands, shopping? N  Some recent data might be hidden   Most recent fall risk assessment: Fall Risk  10/25/2019  Falls in the past year? 0  Number falls in past yr: 0  Injury with Fall? 0  Follow up Falls evaluation completed    Most recent depression screenings: PHQ 2/9 Scores 10/25/2019 09/12/2018  PHQ - 2 Score 0 0  PHQ- 9 Score 1 -   Most recent cognitive screening: 6CIT Screen 09/12/2018  What Year? 0 points  What month? 0 points  What time? 0 points  Count back from 20 0 points  Months in reverse 0 points  Repeat  phrase 0 points  Total Score 0   Most recent Audit-C alcohol use screening Alcohol Use Disorder Test (AUDIT) 10/25/2019  1. How often do you have a drink containing alcohol? 3  2. How many drinks containing alcohol do you have on a typical day when you are drinking? 0  3. How often do you have six or more drinks on one occasion? 0  AUDIT-C Score 3  4. How often during the last year have you found that you were not able to stop drinking once you had started? 0  5. How often during the last year have you failed to do what was normally expected from you because of drinking? 0  6. How often during the last year have you needed a first drink in the morning to get yourself going after a heavy drinking session? 0  7. How often during the last year have you had a feeling of guilt of remorse after drinking? 0  8. How often during the last year have you been unable to remember what happened the night before because you had been drinking? 0  9. Have you or someone  else been injured as a result of your drinking? 0  10. Has a relative or friend or a doctor or another health worker been concerned about your drinking or suggested you cut down? 0  Alcohol Use Disorder Identification Test Final Score (AUDIT) 3  Alcohol Brief Interventions/Follow-up AUDIT Score <7 follow-up not indicated   A score of 3 or more in women, and 4 or more in men indicates increased risk for alcohol abuse, EXCEPT if all of the points are from question 1   No results found for any visits on 10/25/19.  Assessment & Plan     Annual wellness visit done today including the all of the following: Reviewed patient's Family Medical History Reviewed and updated list of patient's medical providers Assessment of cognitive impairment was done Assessed patient's functional ability Established a written schedule for health screening [place] Completed and Reviewed  Exercise Activities and Dietary  recommendations Goals    . DIET - INCREASE WATER INTAKE     Recommend increasing water intake to 4 glasses a day.       Immunization History  Administered Date(s) Administered  . Fluad Quad(high Dose 65+) 10/23/2018  . Hepatitis B 10/11/1991, 11/13/1991, 04/09/1992  . Influenza Split 11/02/2006  . Influenza, High Dose Seasonal PF 10/30/2015, 10/19/2017, 10/09/2019  . Influenza-Unspecified 11/14/2016  . PFIZER SARS-COV-2 Vaccination 01/31/2019, 02/21/2019  . Pneumococcal Conjugate-13 03/20/2014  . Pneumococcal Polysaccharide-23 11/19/2003, 12/27/2010  . Td 12/09/2008  . Zoster 02/08/2006    Health Maintenance  Topic Date Due  . TETANUS/TDAP  12/10/2018  . DEXA SCAN  04/29/2022  . COLONOSCOPY  12/01/2022  . INFLUENZA VACCINE  Completed  . COVID-19 Vaccine  Completed  . PNA vac Low Risk Adult  Completed     Discussed health benefits of physical activity, and encouraged her to engage in regular exercise appropriate for her age and condition.    1. Hypothyroidism, postop Clinically euthyroid.  - TSH - T4, free  2. Osteopenia, unspecified location UTD on BMD, normal vitamin D levels.   3. Encounter for special screening examination for cardiovascular disorder  - Lipid panel      The entirety of the information documented in the History of Present Illness, Review of Systems and Physical Exam were personally obtained by me. Portions of this information were initially documented by the CMA and reviewed by me for thoroughness and accuracy.      Lelon Huh, MD  [facility] [phone] (phone) [phone] (fax)  [place]

## 2019-10-25 ENCOUNTER — Other Ambulatory Visit: Payer: Self-pay

## 2019-10-25 ENCOUNTER — Ambulatory Visit (INDEPENDENT_AMBULATORY_CARE_PROVIDER_SITE_OTHER): Payer: Medicare HMO | Admitting: Family Medicine

## 2019-10-25 ENCOUNTER — Encounter: Payer: Self-pay | Admitting: Family Medicine

## 2019-10-25 VITALS — BP 122/72 | HR 89 | Temp 98.3°F | Resp 16 | Ht 61.0 in | Wt 111.0 lb

## 2019-10-25 DIAGNOSIS — Z136 Encounter for screening for cardiovascular disorders: Secondary | ICD-10-CM

## 2019-10-25 DIAGNOSIS — E890 Postprocedural hypothyroidism: Secondary | ICD-10-CM | POA: Diagnosis not present

## 2019-10-25 DIAGNOSIS — M8580 Other specified disorders of bone density and structure, unspecified site: Secondary | ICD-10-CM | POA: Diagnosis not present

## 2019-10-25 DIAGNOSIS — Z0000 Encounter for general adult medical examination without abnormal findings: Secondary | ICD-10-CM | POA: Diagnosis not present

## 2019-10-25 NOTE — Progress Notes (Signed)
Complete physical exam   Patient: Suzanne Morgan   DOB: 1940-06-07   78 y.o. Female  MRN: [id number] Visit Date: 10/25/2019  Today's healthcare provider: Lelon Huh, MD   Chief Complaint  Patient presents with  . Hypertension  . Hypothyroidism  . Medicare Wellness   Subjective    Suzanne Morgan is a 79 y.o. female who presents today for a complete physical exam.  She reports consuming a low fat and low sodium diet. Home exercise routine includes walking 30 hrs per days. She generally feels well. She reports sleeping well. She does have additional problems to discuss today.    Past Medical History:  Diagnosis Date  . Herpes zoster without complication 50/35/4656  . History of chicken pox   . History of mumps   . Hypertension   . Hypothyroidism   . Mitral valve prolapse   . Squamous cell carcinoma    Past Surgical History:  Procedure Laterality Date  . BREAST CYST ASPIRATION Right many yrs ago   negative  . COLONOSCOPY WITH PROPOFOL N/A 11/30/2017   Procedure: COLONOSCOPY WITH PROPOFOL;  Surgeon: Virgel Manifold, MD;  Location: [facility];  Service: Endoscopy;  Laterality: N/A;  . THYROIDECTOMY    . UPPER GI ENDOSCOPY  06/13/07   HIATAL HERNIA, IRREGEULAR Z-LINE     Family History  Problem Relation Age of Onset  . Stroke Mother   . Heart attack Father    Allergies  Allergen Reactions  . Clindamycin/Lincomycin Diarrhea  . Sulfa Antibiotics   . Other Rash    KAPIDEX     Patient Care Team: Birdie Sons, MD as PCP - General (Family Medicine) Pa, [place] as Consulting Physician (Optometry) Benitez-Graham, Cori Razor, MD (Dermatology) Virgel Manifold, MD as Consulting Physician (Gastroenterology)   Medications: Outpatient Medications Prior to Visit  Medication Sig  . amLODipine (NORVASC) 2.5 MG tablet TAKE 1 TABLET BY MOUTH EVERY DAY  . calcium carbonate (OS-CAL) 600 MG TABS tablet Take 600 mg by mouth daily with breakfast.   .  levothyroxine (SYNTHROID) 100 MCG tablet TAKE 1 TABLET BY MOUTH EVERY DAY  . Multiple Vitamin tablet Take 1 tablet by mouth daily.    No facility-administered medications prior to visit.       Objective    BP 122/72 (BP Location: Left Arm, Patient Position: Sitting, Cuff Size: Normal)   Pulse 89   Temp 98.3 F (36.8 C) (Oral)   Resp 16   Ht 5\' 1"  (1.549 m)   Wt 111 lb (50.3 kg)   BMI 20.97 kg/m    Physical Exam   General Appearance:    Well developed, well nourished female. Alert, cooperative, in no acute distress, appears stated age   Head:    Normocephalic, without obvious abnormality, atraumatic  Eyes:    PERRL, conjunctiva/corneas clear, EOM's intact, fundi    benign, both eyes  Ears:    Normal TM's and external ear canals, both ears  Neck:   Supple, symmetrical, trachea midline, no adenopathy;    thyroid:  no enlargement/tenderness/nodules; no carotid   bruit or JVD  Back:     Symmetric, no curvature, ROM normal, no CVA tenderness  Lungs:     Clear to auscultation bilaterally, respirations unlabored  Chest Wall:    No tenderness or deformity   Heart:    Normal heart rate. Normal rhythm. 2/6 systolic murmur LUSB  Breast Exam:    deferred  Abdomen:  Soft, non-tender, bowel sounds active all four quadrants,    no masses, no organomegaly  Pelvic:    not indicated; post-menopausal, no abnormal Pap smears in past  Extremities:   All extremities are intact. No cyanosis or edema  Pulses:   2+ and symmetric all extremities  Skin:   Healing abrasion left lateral right leg at site of previous Scc excistion  Lymph nodes:   Cervical, supraclavicular, and axillary nodes normal  Neurologic:   CNII-XII intact, normal strength, sensation and reflexes    throughout      Assessment & Plan    Routine Health Maintenance and Physical Exam  Exercise Activities and Dietary recommendations Goals    . DIET - INCREASE WATER INTAKE     Recommend increasing water intake to 4 glasses  a day.       Immunization History  Administered Date(s) Administered  . Fluad Quad(high Dose 65+) 10/23/2018  . Hepatitis B 10/11/1991, 11/13/1991, 04/09/1992  . Influenza Split 11/02/2006  . Influenza, High Dose Seasonal PF 10/30/2015, 10/19/2017, 10/09/2019  . Influenza-Unspecified 11/14/2016  . PFIZER SARS-COV-2 Vaccination 01/31/2019, 02/21/2019  . Pneumococcal Conjugate-13 03/20/2014  . Pneumococcal Polysaccharide-23 11/19/2003, 12/27/2010  . Td 12/09/2008  . Zoster 02/08/2006    Health Maintenance  Topic Date Due  . TETANUS/TDAP  12/10/2018  . DEXA SCAN  04/29/2022  . COLONOSCOPY  12/01/2022  . INFLUENZA VACCINE  Completed  . COVID-19 Vaccine  Completed  . PNA vac Low Risk Adult  Completed    Discussed health benefits of physical activity, and encouraged her to engage in regular exercise appropriate for her age and condition.         The entirety of the information documented in the History of Present Illness, Review of Systems and Physical Exam were personally obtained by me. Portions of this information were initially documented by the CMA and reviewed by me for thoroughness and accuracy.      Lelon Huh, MD  [facility] [phone] (phone) [phone] (fax)  [place]

## 2019-10-25 NOTE — Patient Instructions (Addendum)
.   Please review the attached list of medications and notify my office if there are any errors.   . You are due for a Tdap (tetanus-diptheria-pertussis vaccine) which protects you from tetanus and whooping cough. Please check with your insurance plan or pharmacy regarding coverage for this vaccine.     The CDC recommends two doses of Shingrix (the shingles vaccine) separated by 2 to 6 months for adults age 80 years and older. I recommend checking with your pharmacy plan regarding coverage for this vaccine.     . Please go to the lab draw station in [address] on the second floor of [facility]  when you are fasting for 8 hours. Normal hours are 8:00am to 12:30pm and 1:30pm to 4:00pm Monday through Friday

## 2019-10-28 ENCOUNTER — Encounter: Payer: Medicare HMO | Admitting: Family Medicine

## 2019-10-30 DIAGNOSIS — Z136 Encounter for screening for cardiovascular disorders: Secondary | ICD-10-CM | POA: Diagnosis not present

## 2019-10-30 DIAGNOSIS — E890 Postprocedural hypothyroidism: Secondary | ICD-10-CM | POA: Diagnosis not present

## 2019-10-31 LAB — T4, FREE: Free T4: 1.63 ng/dL (ref 0.82–1.77)

## 2019-10-31 LAB — LIPID PANEL
Chol/HDL Ratio: 1.9 ratio (ref 0.0–4.4)
Cholesterol, Total: 226 mg/dL — ABNORMAL HIGH (ref 100–199)
HDL: 121 mg/dL (ref >39)
LDL Chol Calc (NIH): 90 mg/dL (ref 0–99)
Triglycerides: 89 mg/dL (ref 0–149)
VLDL Cholesterol Cal: 15 mg/dL (ref 5–40)

## 2019-10-31 LAB — TSH: TSH: 1.22 u[IU]/mL (ref 0.450–4.500)

## 2019-11-07 ENCOUNTER — Other Ambulatory Visit: Payer: Self-pay

## 2019-11-07 ENCOUNTER — Ambulatory Visit (INDEPENDENT_AMBULATORY_CARE_PROVIDER_SITE_OTHER): Payer: Medicare Other

## 2019-11-07 DIAGNOSIS — Z23 Encounter for immunization: Secondary | ICD-10-CM | POA: Diagnosis not present

## 2019-11-14 DIAGNOSIS — R69 Illness, unspecified: Secondary | ICD-10-CM | POA: Diagnosis not present

## 2019-11-18 DIAGNOSIS — H2513 Age-related nuclear cataract, bilateral: Secondary | ICD-10-CM | POA: Diagnosis not present

## 2019-11-28 ENCOUNTER — Telehealth: Payer: Self-pay

## 2019-11-28 ENCOUNTER — Ambulatory Visit: Payer: Self-pay

## 2019-11-28 NOTE — Telephone Encounter (Signed)
Copied from [place] [id number]. Topic: Appointment Scheduling - Scheduling Inquiry for Clinic >> Nov 28, 2019 10:28 AM Oneta Rack wrote: Reason for CRM: patient would like to schedule her AWV back to back with her spouse. Spouse is scheduled for 11/19/2020 but [facility] has no availability. Patient requesting appointment and would like a call back to confirm.  Patient spouse mrn # MRN: [id number]

## 2019-12-02 NOTE — Telephone Encounter (Signed)
Pt called last wk and wants to clarify as call not returned, Timeframe of husband's AWV does not have to be back to back, close proximity  just so they can keep up with it easier, phone # for contact [phone] or [phone]

## 2019-12-02 NOTE — Telephone Encounter (Signed)
Apt scheduled for 11/19/2020 at 3:40 (Phone visit.)  Thanks,   Mickel Baas

## 2020-01-06 DIAGNOSIS — H2513 Age-related nuclear cataract, bilateral: Secondary | ICD-10-CM | POA: Diagnosis not present

## 2020-01-08 DIAGNOSIS — L570 Actinic keratosis: Secondary | ICD-10-CM | POA: Diagnosis not present

## 2020-01-08 DIAGNOSIS — L720 Epidermal cyst: Secondary | ICD-10-CM | POA: Diagnosis not present

## 2020-01-08 DIAGNOSIS — L821 Other seborrheic keratosis: Secondary | ICD-10-CM | POA: Diagnosis not present

## 2020-01-08 DIAGNOSIS — Z872 Personal history of diseases of the skin and subcutaneous tissue: Secondary | ICD-10-CM | POA: Diagnosis not present

## 2020-01-08 DIAGNOSIS — L578 Other skin changes due to chronic exposure to nonionizing radiation: Secondary | ICD-10-CM | POA: Diagnosis not present

## 2020-01-08 DIAGNOSIS — Z859 Personal history of malignant neoplasm, unspecified: Secondary | ICD-10-CM | POA: Diagnosis not present

## 2020-01-29 ENCOUNTER — Ambulatory Visit: Payer: Self-pay

## 2020-01-29 NOTE — Telephone Encounter (Signed)
Pt. States she felt "a little dizzy this afternoon and checked her BP - 170/89." Would like to have it re-checked in the office. No availability until Friday. 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 Please advise. Will go ahead and take her dose of amlodipine today Will go to UC for worsening of symptoms..Pleae advise.  Reason for Disposition . Systolic BP  >= 180 OR Diastolic >= 110  Answer Assessment - Initial Assessment Questions 1. BLOOD PRESSURE: "What is the blood pressure?" "Did you take at least two measurements 5 minutes apart?"     170/89 2. ONSET: "When did you take your blood pressure?"     Now 3. HOW: "How did you obtain the blood pressure?" (e.g., visiting nurse, automatic home BP monitor)     Home cuff 4. HISTORY: "Do you have a history of high blood pressure?"     Yes 5. MEDICATIONS: "Are you taking any medications for blood pressure?" "Have you missed any doses recently?"     Yes 6. OTHER SYMPTOMS: "Do you have any symptoms?" (e.g., headache, chest pain, blurred vision, difficulty breathing, weakness)     Some dizziness 7. PREGNANCY: "Is there any chance you are pregnant?" "When was your last menstrual period?"     No  Protocols used: BLOOD PRESSURE - HIGH-A-AH

## 2020-01-30 NOTE — Telephone Encounter (Signed)
Patient has been scjeduled for 2:20Pm with Maurine Minister

## 2020-01-31 ENCOUNTER — Encounter: Payer: Self-pay | Admitting: Family Medicine

## 2020-01-31 ENCOUNTER — Other Ambulatory Visit: Payer: Self-pay

## 2020-01-31 ENCOUNTER — Ambulatory Visit (INDEPENDENT_AMBULATORY_CARE_PROVIDER_SITE_OTHER): Payer: Medicare HMO | Admitting: Family Medicine

## 2020-01-31 VITALS — BP 156/84 | HR 84 | Temp 98.9°F | Resp 16 | Wt 112.0 lb

## 2020-01-31 DIAGNOSIS — W19XXXA Unspecified fall, initial encounter: Secondary | ICD-10-CM | POA: Diagnosis not present

## 2020-01-31 DIAGNOSIS — I10 Essential (primary) hypertension: Secondary | ICD-10-CM

## 2020-01-31 DIAGNOSIS — I059 Rheumatic mitral valve disease, unspecified: Secondary | ICD-10-CM

## 2020-01-31 NOTE — Progress Notes (Signed)
Established patient visit   Patient: Suzanne Morgan   DOB: 1940/12/01   80 y.o. Female  MRN: [id number] Visit Date: 01/31/2020  Today's healthcare provider: Vernie Murders, PA-C   Chief Complaint  Patient presents with  . Fall   Subjective    Fall Incident onset: Wednesday. The fall occurred while standing. Impact surface: on to a self. There was no blood loss. Pain location: none. The patient is experiencing no pain. Pertinent negatives include no fever.  patient reports blood pressure was 168/89 a few minutes after fall.   Patient Active Problem List   Diagnosis Date Noted  . Special screening for malignant neoplasms, colon   . Benign neoplasm of ascending colon   . Hypertrophy of anal papillae   . History of Clostridium difficile colitis 06/13/2017  . 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 Abnormal LFTs 03/05/2015  . Osteopenia 12/05/2007  . Esophagitis, reflux 03/20/2007  . Hypothyroidism, postop 01/24/1973   Social History   Tobacco Use  . Smoking status: Never Smoker  . Smokeless tobacco: Never Used  Vaping Use  . Vaping Use: Never used  Substance Use Topics  . Alcohol use: Yes    Alcohol/week: 1.0 - 2.0 standard drink    Types: 1 - 2 Glasses of wine per week  . Drug use: No   Allergies  Allergen Reactions  . Clindamycin/Lincomycin Diarrhea  . Sulfa Antibiotics   . Other Rash    KAPIDEX    Past Surgical History:  Procedure Laterality Date  . BREAST CYST ASPIRATION Right many yrs ago   negative  . COLONOSCOPY WITH PROPOFOL N/A 11/30/2017   Procedure: COLONOSCOPY WITH PROPOFOL;  Surgeon: Virgel Manifold, MD;  Location: [facility] END[facility]COPY;  Service: Endoscopy;  Laterality: N/A;  . SQUAMOUS CELL CARCINOMA EXCISION Right    Right lateral lower leg Dr. Aubery Lapping  . THYROIDECTOMY    . UPPER GI ENDOSCOPY  06/13/07   HIATAL HERNIA, IRREGEULAR Z-LINE   Family History  Problem Relation Age of Onset  . Stroke Mother   . Heart attack Father     Social History   Tobacco Use  . Smoking status: Never Smoker  . Smokeless tobacco: Never Used  Vaping Use  . Vaping Use: Never used  Substance Use Topics  . Alcohol use: Yes    Alcohol/week: 1.0 - 2.0 standard drink    Types: 1 - 2 Glasses of wine per week  . Drug use: No      Medications: Outpatient Medications Prior to Visit  Medication Sig  . amLODipine (NORVASC) 2.5 MG tablet TAKE 1 TABLET BY MOUTH EVERY DAY  . calcium carbonate (OS-CAL) 600 MG TABS tablet Take 600 mg by mouth daily with breakfast.   . levothyroxine (SYNTHROID) 100 MCG tablet TAKE 1 TABLET BY MOUTH EVERY DAY  . Multiple Vitamin tablet Take 1 tablet by mouth daily.    No facility-administered medications prior to visit.    Review of Systems  Constitutional: Negative for appetite change, chills, fever and unexpected weight change.  Eyes: Negative for visual disturbance.  Respiratory: Negative for shortness of breath and wheezing.   Cardiovascular: Negative for chest pain, palpitations and leg swelling.    Last CBC Lab Results  Component Value Date   WBC 8.6 02/27/2019   HGB 15.4 02/27/2019   HCT 45.5 02/27/2019   MCV 90 02/27/2019   MCH 30.3 02/27/2019   RDW 11.7 02/27/2019   PLT 162 [id number]   Last metabolic panel Lab  Results  Component Value Date   GLUCOSE 78 02/27/2019   NA 141 02/27/2019   K 4.5 02/27/2019   CL 100 02/27/2019   CO2 24 02/27/2019   BUN 14 02/27/2019   CREATININE 0.97 02/27/2019   GFRNONAA 56 (L) 02/27/2019   GFRAA 64 02/27/2019   CALCIUM 9.8 02/27/2019   PROT 7.4 02/27/2019   ALBUMIN 4.9 (H) 02/27/2019   LABGLOB 2.5 02/27/2019   AGRATIO 2.0 02/27/2019   BILITOT 0.7 02/27/2019   ALKPHOS 85 02/27/2019   AST 30 02/27/2019   ALT 19 02/27/2019      Objective    BP (!) 156/84 (BP Location: Right Arm, Patient Position: Sitting, Cuff Size: Normal)   Pulse 84   Temp 98.9 F (37.2 C) (Oral)   Resp 16   Wt 112 lb (50.8 kg)   SpO2 97%   BMI 21.16 kg/m  BP  Readings from Last 3 Encounters:  01/31/20 (!) 156/84  10/25/19 122/72  02/27/19 130/70   Wt Readings from Last 3 Encounters:  01/31/20 112 lb (50.8 kg)  10/25/19 111 lb (50.3 kg)  02/27/19 103 lb (46.7 kg)    Physical Exam Constitutional:      General: She is not in acute distress.    Appearance: She is well-developed and well-nourished.  HENT:     Head: Normocephalic and atraumatic.     Right Ear: Hearing normal.     Left Ear: Hearing normal.     Nose: Nose normal.  Eyes:     General: Lids are normal. No scleral icterus.       Right eye: No discharge.        Left eye: No discharge.     Conjunctiva/sclera: Conjunctivae normal.  Neck:     Vascular: No carotid bruit.  Cardiovascular:     Rate and Rhythm: Normal rate and regular rhythm.     Heart sounds: Murmur heard.    Pulmonary:     Effort: Pulmonary effort is normal. No respiratory distress.     Breath sounds: Normal breath sounds.  Abdominal:     General: Bowel sounds are normal.  Musculoskeletal:        General: Normal range of motion.     Cervical back: Neck supple.  Skin:    General: Skin is intact.     Findings: No lesion or rash.  Neurological:     General: No focal deficit present.     Mental Status: She is alert and oriented to person, place, and time.  Psychiatric:        Mood and Affect: Mood and affect normal.        Speech: Speech normal.        Behavior: Behavior normal.        Thought Content: Thought content normal.     No results found for any visits on 01/31/20.  Assessment & Plan     1. Fall, initial encounter Onset of a fall at home on 01-29-20. No prior dizziness or loss of consciousness. No laceration or hematomas noted. Feeling well today. Monitor BP and heart rate at home. Recheck   2. Mitral valve disorder Chronic heart murmur noticed in the mid back and left sternal border. No chest pains or palpitations.  3. Essential hypertension Increase in systolic pressure. Continue  Amlodipine 2.5 mg qd and restrict salt intake.   No follow-ups on file.      I, Lajoya Dombek, PA-C, have reviewed all documentation for this visit. The documentation on  01/31/20 for the exam, diagnosis, procedures, and orders are all accurate and complete.    Vernie Murders, PA-C  [employer] [phone] (phone) [phone] (fax)  [place]

## 2020-02-05 ENCOUNTER — Other Ambulatory Visit: Payer: Self-pay | Admitting: Family Medicine

## 2020-02-05 DIAGNOSIS — Z289 Immunization not carried out for unspecified reason: Secondary | ICD-10-CM

## 2020-02-05 DIAGNOSIS — Z23 Encounter for immunization: Secondary | ICD-10-CM

## 2020-02-05 MED ORDER — TETANUS-DIPHTH-ACELL PERTUSSIS 5-2.5-18.5 LF-MCG/0.5 IM SUSY: 0.5000 mL | PREFILLED_SYRINGE | Freq: Once | INTRAMUSCULAR | 0 refills | Status: AC

## 2020-02-05 MED ORDER — SHINGRIX 50 MCG/0.5ML IM SUSR: 0.5000 mL | Freq: Once | INTRAMUSCULAR | 0 refills | Status: AC

## 2020-02-24 DIAGNOSIS — H2512 Age-related nuclear cataract, left eye: Secondary | ICD-10-CM | POA: Diagnosis not present

## 2020-02-24 DIAGNOSIS — I10 Essential (primary) hypertension: Secondary | ICD-10-CM | POA: Diagnosis not present

## 2020-02-25 ENCOUNTER — Encounter: Payer: Self-pay | Admitting: Ophthalmology

## 2020-02-25 ENCOUNTER — Other Ambulatory Visit: Payer: Self-pay

## 2020-02-28 ENCOUNTER — Other Ambulatory Visit
Admission: RE | Admit: 2020-02-28 | Discharge: 2020-02-28 | Disposition: A | Discharge status: Home / Self Care | Payer: Medicare HMO | Source: Ambulatory Visit | Attending: Ophthalmology | Admitting: Ophthalmology

## 2020-02-28 ENCOUNTER — Other Ambulatory Visit: Payer: Self-pay

## 2020-02-28 DIAGNOSIS — Z20822 Contact with and (suspected) exposure to covid-19: Secondary | ICD-10-CM | POA: Insufficient documentation

## 2020-02-28 DIAGNOSIS — Z01812 Encounter for preprocedural laboratory examination: Secondary | ICD-10-CM | POA: Insufficient documentation

## 2020-02-28 NOTE — Discharge Instructions (Signed)

## 2020-02-29 LAB — SARS CORONAVIRUS 2 (TAT 6-24 HRS): SARS Coronavirus 2: NEGATIVE

## 2020-03-03 ENCOUNTER — Encounter
Admission: RE | Disposition: A | Discharge status: Home / Self Care | Payer: Self-pay | Source: Home / Self Care | Attending: Ophthalmology

## 2020-03-03 ENCOUNTER — Ambulatory Visit: Payer: Medicare HMO | Admitting: Anesthesiology

## 2020-03-03 ENCOUNTER — Ambulatory Visit
Admission: RE | Admit: 2020-03-03 | Discharge: 2020-03-03 | Disposition: A | Discharge status: Home / Self Care | Payer: Medicare HMO | Attending: Ophthalmology | Admitting: Ophthalmology

## 2020-03-03 ENCOUNTER — Other Ambulatory Visit: Payer: Self-pay

## 2020-03-03 ENCOUNTER — Encounter: Payer: Self-pay | Admitting: Ophthalmology

## 2020-03-03 DIAGNOSIS — Z79890 Hormone replacement therapy (postmenopausal): Secondary | ICD-10-CM | POA: Diagnosis not present

## 2020-03-03 DIAGNOSIS — Z882 Allergy status to sulfonamides status: Secondary | ICD-10-CM | POA: Diagnosis not present

## 2020-03-03 DIAGNOSIS — H2512 Age-related nuclear cataract, left eye: Secondary | ICD-10-CM | POA: Diagnosis not present

## 2020-03-03 DIAGNOSIS — Z79899 Other long term (current) drug therapy: Secondary | ICD-10-CM | POA: Diagnosis not present

## 2020-03-03 DIAGNOSIS — Z881 Allergy status to other antibiotic agents status: Secondary | ICD-10-CM | POA: Diagnosis not present

## 2020-03-03 DIAGNOSIS — H25812 Combined forms of age-related cataract, left eye: Secondary | ICD-10-CM | POA: Diagnosis not present

## 2020-03-03 HISTORY — PX: CATARACT EXTRACTION W/PHACO: SHX586

## 2020-03-03 SURGERY — PHACOEMULSIFICATION, CATARACT, WITH IOL INSERTION
Anesthesia: Monitor Anesthesia Care | Site: Eye | Laterality: Left

## 2020-03-03 MED ORDER — EPINEPHRINE PF 1 MG/ML IJ SOLN
INTRAOCULAR | Status: DC | PRN
  Administered 2020-03-03: 49 mL via OPHTHALMIC

## 2020-03-03 MED ORDER — MOXIFLOXACIN HCL 0.5 % OP SOLN
OPHTHALMIC | Status: DC | PRN
  Administered 2020-03-03: 0.2 mL via OPHTHALMIC

## 2020-03-03 MED ORDER — TETRACAINE HCL 0.5 % OP SOLN
1.0000 [drp] | OPHTHALMIC | Status: DC | PRN
  Administered 2020-03-03 (×3): 1 [drp] via OPHTHALMIC

## 2020-03-03 MED ORDER — ONDANSETRON HCL 4 MG/2ML IJ SOLN
INTRAMUSCULAR | Status: DC | PRN
  Administered 2020-03-03: 4 mg via INTRAVENOUS

## 2020-03-03 MED ORDER — BRIMONIDINE TARTRATE-TIMOLOL 0.2-0.5 % OP SOLN
OPHTHALMIC | Status: DC | PRN
  Administered 2020-03-03: 1 [drp] via OPHTHALMIC

## 2020-03-03 MED ORDER — ARMC OPHTHALMIC DILATING DROPS
1.0000 "application " | OPHTHALMIC | Status: DC | PRN
  Administered 2020-03-03 (×3): 1 via OPHTHALMIC

## 2020-03-03 MED ORDER — LIDOCAINE HCL (PF) 2 % IJ SOLN
INTRAOCULAR | Status: DC | PRN
  Administered 2020-03-03: 2 mL

## 2020-03-03 MED ORDER — NA CHONDROIT SULF-NA HYALURON 40-17 MG/ML IO SOLN
INTRAOCULAR | Status: DC | PRN
  Administered 2020-03-03: 1 mL via INTRAOCULAR

## 2020-03-03 MED ORDER — FENTANYL CITRATE (PF) 100 MCG/2ML IJ SOLN
INTRAMUSCULAR | Status: DC | PRN
  Administered 2020-03-03: 50 ug via INTRAVENOUS

## 2020-03-03 MED ORDER — MIDAZOLAM HCL 2 MG/2ML IJ SOLN
INTRAMUSCULAR | Status: DC | PRN
  Administered 2020-03-03: 2 mg via INTRAVENOUS

## 2020-03-03 SURGICAL SUPPLY — 19 items
CANNULA ANT/CHMB 27G (MISCELLANEOUS) ×2 IMPLANT
CANNULA ANT/CHMB 27GA (MISCELLANEOUS) ×4 IMPLANT
GLOVE SURG LX 8.0 MICRO (GLOVE) ×1
GLOVE SURG LX STRL 8.0 MICRO (GLOVE) ×1 IMPLANT
GLOVE SURG TRIUMPH 8.0 PF LTX (GLOVE) ×2 IMPLANT
GOWN STRL REUS W/ TWL LRG LVL3 (GOWN DISPOSABLE) ×2 IMPLANT
GOWN STRL REUS W/TWL LRG LVL3 (GOWN DISPOSABLE) ×4
LENS IOL TECNIS EYHANCE 20.5 (Intraocular Lens) ×1 IMPLANT
MARKER SKIN DUAL TIP RULER LAB (MISCELLANEOUS) ×2 IMPLANT
NDL FILTER BLUNT 18X1 1/2 (NEEDLE) ×1 IMPLANT
NEEDLE FILTER BLUNT 18X 1/2SAF (NEEDLE) ×1
NEEDLE FILTER BLUNT 18X1 1/2 (NEEDLE) ×1 IMPLANT
PACK EYE AFTER SURG (MISCELLANEOUS) ×2 IMPLANT
PACK OPTHALMIC (MISCELLANEOUS) ×2 IMPLANT
PACK PORFILIO (MISCELLANEOUS) ×2 IMPLANT
SYR 3ML LL SCALE MARK (SYRINGE) ×2 IMPLANT
SYR TB 1ML LUER SLIP (SYRINGE) ×2 IMPLANT
WATER STERILE IRR 250ML POUR (IV SOLUTION) ×2 IMPLANT
WIPE NON LINTING 3.25X3.25 (MISCELLANEOUS) ×2 IMPLANT

## 2020-03-03 NOTE — Transfer of Care (Signed)
Immediate Anesthesia Transfer of Care Note  Patient: Suzanne Morgan  Procedure(s) Performed: CATARACT EXTRACTION PHACO AND INTRAOCULAR LENS PLACEMENT (IOC) LEFT (Left Eye)  Patient Location: PACU  Anesthesia Type: MAC  Level of Consciousness: awake, alert  and patient cooperative  Airway and Oxygen Therapy: Patient Spontanous Breathing and Patient connected to supplemental oxygen  Post-op Assessment: Post-op Vital signs reviewed, Patient's Cardiovascular Status Stable, Respiratory Function Stable, Patent Airway and No signs of Nausea or vomiting  Post-op Vital Signs: Reviewed and stable  Complications: No complications documented.

## 2020-03-03 NOTE — Anesthesia Postprocedure Evaluation (Signed)
Anesthesia Post Note  Patient: Suzanne Morgan  Procedure(s) Performed: CATARACT EXTRACTION PHACO AND INTRAOCULAR LENS PLACEMENT (IOC) LEFT 4.84 00:40.3  (Left Eye)     Patient location during evaluation: PACU Anesthesia Type: MAC Level of consciousness: awake and alert Pain management: pain level controlled Vital Signs Assessment: post-procedure vital signs reviewed and stable Respiratory status: spontaneous breathing Cardiovascular status: stable Anesthetic complications: no   No complications documented.  Gillian Scarce

## 2020-03-03 NOTE — Progress Notes (Signed)
No risk at this time. 

## 2020-03-03 NOTE — Anesthesia Procedure Notes (Signed)
Procedure Name: MAC Date/Time: 03/03/2020 11:15 AM Performed by: Jeannene Patella, CRNA Pre-anesthesia Checklist: Patient identified, Emergency Drugs available, Suction available, Timeout performed and Patient being monitored Patient Re-evaluated:Patient Re-evaluated prior to induction Oxygen Delivery Method: Nasal cannula Placement Confirmation: positive ETCO2

## 2020-03-03 NOTE — H&P (Signed)
[facility]   Primary Care Physician:  Birdie Sons, MD Ophthalmologist: Dr. Benay Pillow  Pre-Procedure History & Physical: HPI:  Suzanne Morgan is a 80 y.o. female here for cataract surgery.   Past Medical History:  Diagnosis Date  . Herpes zoster without complication 07/24/1599  . History of chicken pox   . History of mumps   . Hypertension   . Hypothyroidism   . Mitral valve prolapse   . Squamous cell carcinoma    right lower leg. excised by Dr. Aubery Lapping    Past Surgical History:  Procedure Laterality Date  . BREAST CYST ASPIRATION Right many yrs ago   negative  . COLONOSCOPY WITH PROPOFOL N/A 11/30/2017   Procedure: COLONOSCOPY WITH PROPOFOL;  Surgeon: Virgel Manifold, MD;  Location: [facility] END[facility]COPY;  Service: Endoscopy;  Laterality: N/A;  . SQUAMOUS CELL CARCINOMA EXCISION Right    Right lateral lower leg Dr. Aubery Lapping  . THYROIDECTOMY    . UPPER GI ENDOSCOPY  06/13/07   HIATAL HERNIA, IRREGEULAR Z-LINE    Prior to Admission medications   Medication Sig Start Date End Date Taking? Authorizing Provider  amLODipine (NORVASC) 2.5 MG tablet TAKE 1 TABLET BY MOUTH EVERY DAY 06/05/19  Yes Birdie Sons, MD  calcium carbonate (OS-CAL) 600 MG TABS tablet Take 600 mg by mouth daily with breakfast.  12/26/06  Yes [provider]  levothyroxine (SYNTHROID) 100 MCG tablet TAKE 1 TABLET BY MOUTH EVERY DAY 05/19/19  Yes Birdie Sons, MD  Multiple Vitamin tablet Take 1 tablet by mouth daily.  12/13/05  Yes [provider]    Allergies as of 01/14/2020 - Review Complete 10/25/2019  Allergen Reaction Noted  . Clindamycin/lincomycin Diarrhea 09/19/2017  . Sulfa antibiotics  03/05/2015  . Other Rash 03/05/2015    Family History  Problem Relation Age of Onset  . Stroke Mother   . Heart attack Father     Social History   Socioeconomic History  . Marital status: Married    Spouse name: Not on file  . Number of children: 1  .  Years of education: 2 adopted children  . Highest education level: Bachelor's degree (e.g., BA, AB, BS)  Occupational History  . Not on file  Tobacco Use  . Smoking status: Never Smoker  . Smokeless tobacco: Never Used  Vaping Use  . Vaping Use: Never used  Substance and Sexual Activity  . Alcohol use: Yes    Alcohol/week: 1.0 - 2.0 standard drink    Types: 1 - 2 Glasses of wine per week  . Drug use: No  . Sexual activity: Not on file  Other Topics Concern  . Not on file  Social History Narrative   Pt as 1 biological child and 2 adopted.   Social Determinants of Health   Financial Resource Strain: Not on file  Food Insecurity: Not on file  Transportation Needs: Not on file  Physical Activity: Not on file  Stress: Not on file  Social Connections: Not on file  Intimate Partner Violence: Not on file    Review of Systems: See HPI, otherwise negative ROS  Physical Exam: BP (!) 170/86   Pulse (!) 105   Temp 98.6 F (37 C) (Temporal)   Resp 18   Ht 5\' 1"  (1.549 m)   Wt 47.6 kg   SpO2 100%   BMI 19.84 kg/m  General:   Alert,  pleasant and cooperative in NAD Head:  Normocephalic and atraumatic. Respiratory:  Normal work  of breathing.  Impression/Plan: Suzanne Morgan is here for cataract surgery.  Risks, benefits, limitations, and alternatives regarding cataract surgery have been reviewed with the patient.  Questions have been answered.  All parties agreeable.   Birder Robson, MD  03/03/2020, 11:05 AM

## 2020-03-03 NOTE — Op Note (Signed)
PREOPERATIVE DIAGNOSIS:  Nuclear sclerotic cataract of the left eye.   POSTOPERATIVE DIAGNOSIS:  Nuclear sclerotic cataract of the left eye.   OPERATIVE PROCEDURE:@   SURGEON:  Birder Robson, MD.   ANESTHESIA:  Anesthesiologist: Elgie Collard, MD CRNA: Jeannene Patella, CRNA  1.      Managed anesthesia care. 2.     0.33ml of Shugarcaine was instilled following the paracentesis   COMPLICATIONS:  None.   TECHNIQUE:   Stop and chop   DESCRIPTION OF PROCEDURE:  The patient was examined and consented in the preoperative holding area where the aforementioned topical anesthesia was applied to the left eye and then brought back to the Operating Room where the left eye was prepped and draped in the usual sterile ophthalmic fashion and a lid speculum was placed. A paracentesis was created with the side port blade and the anterior chamber was filled with viscoelastic. A near clear corneal incision was performed with the steel keratome. A continuous curvilinear capsulorrhexis was performed with a cystotome followed by the capsulorrhexis forceps. Hydrodissection and hydrodelineation were carried out with BSS on a blunt cannula. The lens was removed in a stop and chop  technique and the remaining cortical material was removed with the irrigation-aspiration handpiece. The capsular bag was inflated with viscoelastic and the Technis [id number] lens was placed in the capsular bag without complication. The remaining viscoelastic was removed from the eye with the irrigation-aspiration handpiece. The wounds were hydrated. The anterior chamber was flushed with BSS and the eye was inflated to physiologic pressure. 0.63ml Vigamox was placed in the anterior chamber. The wounds were found to be water tight. The eye was dressed with Combigan. The patient was given protective glasses to wear throughout the day and a shield with which to sleep tonight. The patient was also given drops with which to begin a drop regimen  today and will follow-up with me in one day. Implant Name Type Inv. Item Serial No. Manufacturer Lot No. LRB No. Used Action  LENS IOL TECNIS EYHANCE 20.5 - [id number] Intraocular Lens LENS IOL TECNIS EYHANCE 20.5 [id number] JOHNSON   Left 1 Implanted    Procedure(s): CATARACT EXTRACTION PHACO AND INTRAOCULAR LENS PLACEMENT (IOC) LEFT 4.84 00:40.3  (Left)  Electronically signed: Birder Robson 03/03/2020 11:27 AM

## 2020-03-03 NOTE — Anesthesia Preprocedure Evaluation (Signed)
Anesthesia Evaluation  Patient identified by MRN, date of birth, ID band Patient awake    Reviewed: Allergy & Precautions, H&P , NPO status , Patient's Chart, lab work & pertinent test results  Airway Mallampati: II  TM Distance: >3 FB Neck ROM: full    Dental no notable dental hx.    Pulmonary neg pulmonary ROS,    Pulmonary exam normal        Cardiovascular hypertension, Pt. on medications and On Medications Normal cardiovascular exam Rhythm:regular Rate:Normal     Neuro/Psych negative neurological ROS  negative psych ROS   GI/Hepatic negative GI ROS, Neg liver ROS,   Endo/Other  Hypothyroidism   Renal/GU      Musculoskeletal   Abdominal   Peds  Hematology negative hematology ROS (+)   Anesthesia Other Findings   Reproductive/Obstetrics                             Anesthesia Physical Anesthesia Plan  ASA: II  Anesthesia Plan: MAC   Post-op Pain Management:    Induction:   PONV Risk Score and Plan:   Airway Management Planned:   Additional Equipment:   Intra-op Plan:   Post-operative Plan:   Informed Consent: I have reviewed the patients History and Physical, chart, labs and discussed the procedure including the risks, benefits and alternatives for the proposed anesthesia with the patient or authorized representative who has indicated his/her understanding and acceptance.       Plan Discussed with:   Anesthesia Plan Comments:         Anesthesia Quick Evaluation

## 2020-03-04 ENCOUNTER — Encounter: Payer: Self-pay | Admitting: Ophthalmology

## 2020-03-09 DIAGNOSIS — H2511 Age-related nuclear cataract, right eye: Secondary | ICD-10-CM | POA: Diagnosis not present

## 2020-03-10 ENCOUNTER — Encounter: Payer: Self-pay | Admitting: Ophthalmology

## 2020-03-10 ENCOUNTER — Other Ambulatory Visit: Payer: Self-pay

## 2020-03-13 ENCOUNTER — Other Ambulatory Visit
Admission: RE | Admit: 2020-03-13 | Discharge: 2020-03-13 | Disposition: A | Discharge status: Home / Self Care | Payer: Medicare HMO | Source: Ambulatory Visit | Attending: Ophthalmology | Admitting: Ophthalmology

## 2020-03-13 ENCOUNTER — Other Ambulatory Visit: Payer: Self-pay

## 2020-03-13 DIAGNOSIS — Z20822 Contact with and (suspected) exposure to covid-19: Secondary | ICD-10-CM | POA: Diagnosis not present

## 2020-03-13 DIAGNOSIS — Z01812 Encounter for preprocedural laboratory examination: Secondary | ICD-10-CM | POA: Insufficient documentation

## 2020-03-13 LAB — SARS CORONAVIRUS 2 (TAT 6-24 HRS): SARS Coronavirus 2: NEGATIVE

## 2020-03-13 NOTE — Discharge Instructions (Signed)

## 2020-03-17 ENCOUNTER — Encounter
Admission: RE | Disposition: A | Discharge status: Home / Self Care | Payer: Self-pay | Source: Home / Self Care | Attending: Ophthalmology

## 2020-03-17 ENCOUNTER — Ambulatory Visit: Payer: Medicare HMO | Admitting: Anesthesiology

## 2020-03-17 ENCOUNTER — Ambulatory Visit
Admission: RE | Admit: 2020-03-17 | Discharge: 2020-03-17 | Disposition: A | Discharge status: Home / Self Care | Payer: Medicare HMO | Attending: Ophthalmology | Admitting: Ophthalmology

## 2020-03-17 ENCOUNTER — Encounter: Payer: Self-pay | Admitting: Ophthalmology

## 2020-03-17 ENCOUNTER — Other Ambulatory Visit: Payer: Self-pay

## 2020-03-17 DIAGNOSIS — Z79890 Hormone replacement therapy (postmenopausal): Secondary | ICD-10-CM | POA: Insufficient documentation

## 2020-03-17 DIAGNOSIS — Z881 Allergy status to other antibiotic agents status: Secondary | ICD-10-CM | POA: Insufficient documentation

## 2020-03-17 DIAGNOSIS — Z79899 Other long term (current) drug therapy: Secondary | ICD-10-CM | POA: Diagnosis not present

## 2020-03-17 DIAGNOSIS — Z882 Allergy status to sulfonamides status: Secondary | ICD-10-CM | POA: Insufficient documentation

## 2020-03-17 DIAGNOSIS — H2511 Age-related nuclear cataract, right eye: Secondary | ICD-10-CM | POA: Diagnosis not present

## 2020-03-17 DIAGNOSIS — H25811 Combined forms of age-related cataract, right eye: Secondary | ICD-10-CM | POA: Diagnosis not present

## 2020-03-17 HISTORY — PX: CATARACT EXTRACTION W/PHACO: SHX586

## 2020-03-17 SURGERY — PHACOEMULSIFICATION, CATARACT, WITH IOL INSERTION
Anesthesia: Monitor Anesthesia Care | Site: Eye | Laterality: Right

## 2020-03-17 MED ORDER — LIDOCAINE HCL (PF) 2 % IJ SOLN
INTRAOCULAR | Status: DC | PRN
  Administered 2020-03-17: 1 mL

## 2020-03-17 MED ORDER — FENTANYL CITRATE (PF) 100 MCG/2ML IJ SOLN
INTRAMUSCULAR | Status: DC | PRN
  Administered 2020-03-17: 50 ug via INTRAVENOUS

## 2020-03-17 MED ORDER — MOXIFLOXACIN HCL 0.5 % OP SOLN
OPHTHALMIC | Status: DC | PRN
  Administered 2020-03-17: 0.2 mL via OPHTHALMIC

## 2020-03-17 MED ORDER — ONDANSETRON HCL 4 MG/2ML IJ SOLN
INTRAMUSCULAR | Status: DC | PRN
  Administered 2020-03-17: 4 mg via INTRAVENOUS

## 2020-03-17 MED ORDER — TETRACAINE HCL 0.5 % OP SOLN
1.0000 [drp] | OPHTHALMIC | Status: DC | PRN
  Administered 2020-03-17 (×3): 1 [drp] via OPHTHALMIC

## 2020-03-17 MED ORDER — ARMC OPHTHALMIC DILATING DROPS
1.0000 "application " | OPHTHALMIC | Status: DC | PRN
  Administered 2020-03-17 (×3): 1 via OPHTHALMIC

## 2020-03-17 MED ORDER — MIDAZOLAM HCL 2 MG/2ML IJ SOLN
INTRAMUSCULAR | Status: DC | PRN
  Administered 2020-03-17 (×2): 1 mg via INTRAVENOUS

## 2020-03-17 MED ORDER — EPINEPHRINE PF 1 MG/ML IJ SOLN
INTRAOCULAR | Status: DC | PRN
  Administered 2020-03-17: 65 mL via OPHTHALMIC

## 2020-03-17 MED ORDER — LACTATED RINGERS IV SOLN: INTRAVENOUS | Status: DC

## 2020-03-17 MED ORDER — BRIMONIDINE TARTRATE-TIMOLOL 0.2-0.5 % OP SOLN
OPHTHALMIC | Status: DC | PRN
  Administered 2020-03-17: 1 [drp] via OPHTHALMIC

## 2020-03-17 MED ORDER — NA CHONDROIT SULF-NA HYALURON 40-17 MG/ML IO SOLN
INTRAOCULAR | Status: DC | PRN
  Administered 2020-03-17: 1 mL via INTRAOCULAR

## 2020-03-17 SURGICAL SUPPLY — 19 items
CANNULA ANT/CHMB 27G (MISCELLANEOUS) ×2 IMPLANT
CANNULA ANT/CHMB 27GA (MISCELLANEOUS) ×4 IMPLANT
GLOVE SURG LX 8.0 MICRO (GLOVE) ×1
GLOVE SURG LX STRL 8.0 MICRO (GLOVE) ×1 IMPLANT
GLOVE SURG TRIUMPH 8.0 PF LTX (GLOVE) ×2 IMPLANT
GOWN STRL REUS W/ TWL LRG LVL3 (GOWN DISPOSABLE) ×2 IMPLANT
GOWN STRL REUS W/TWL LRG LVL3 (GOWN DISPOSABLE) ×4
LENS IOL TECNIS EYHANCE 20.0 (Intraocular Lens) ×1 IMPLANT
MARKER SKIN DUAL TIP RULER LAB (MISCELLANEOUS) ×2 IMPLANT
NDL FILTER BLUNT 18X1 1/2 (NEEDLE) ×1 IMPLANT
NEEDLE FILTER BLUNT 18X 1/2SAF (NEEDLE) ×1
NEEDLE FILTER BLUNT 18X1 1/2 (NEEDLE) ×1 IMPLANT
PACK EYE AFTER SURG (MISCELLANEOUS) ×2 IMPLANT
PACK OPTHALMIC (MISCELLANEOUS) ×2 IMPLANT
PACK PORFILIO (MISCELLANEOUS) ×2 IMPLANT
SYR 3ML LL SCALE MARK (SYRINGE) ×2 IMPLANT
SYR TB 1ML LUER SLIP (SYRINGE) ×2 IMPLANT
WATER STERILE IRR 250ML POUR (IV SOLUTION) ×2 IMPLANT
WIPE NON LINTING 3.25X3.25 (MISCELLANEOUS) ×2 IMPLANT

## 2020-03-17 NOTE — Op Note (Signed)
PREOPERATIVE DIAGNOSIS:  Nuclear sclerotic cataract of the right eye.   POSTOPERATIVE DIAGNOSIS:  H25.11 Cataract   OPERATIVE PROCEDURE:@   SURGEON:  Birder Robson, MD.   ANESTHESIA:  Anesthesiologist: Ardeth Sportsman, MD CRNA: Cameron Ali, CRNA  1.      Managed anesthesia care. 2.      0.68ml of Shugarcaine was instilled in the eye following the paracentesis.   COMPLICATIONS:  None.   TECHNIQUE:   Stop and chop   DESCRIPTION OF PROCEDURE:  The patient was examined and consented in the preoperative holding area where the aforementioned topical anesthesia was applied to the right eye and then brought back to the Operating Room where the right eye was prepped and draped in the usual sterile ophthalmic fashion and a lid speculum was placed. A paracentesis was created with the side port blade and the anterior chamber was filled with viscoelastic. A near clear corneal incision was performed with the steel keratome. A continuous curvilinear capsulorrhexis was performed with a cystotome followed by the capsulorrhexis forceps. Hydrodissection and hydrodelineation were carried out with BSS on a blunt cannula. The lens was removed in a stop and chop  technique and the remaining cortical material was removed with the irrigation-aspiration handpiece. The capsular bag was inflated with viscoelastic and the Technis [id number]  lens was placed in the capsular bag without complication. The remaining viscoelastic was removed from the eye with the irrigation-aspiration handpiece. The wounds were hydrated. The anterior chamber was flushed with BSS and the eye was inflated to physiologic pressure. 0.38ml of Vigamox was placed in the anterior chamber. The wounds were found to be water tight. The eye was dressed with Combigan. The patient was given protective glasses to wear throughout the day and a shield with which to sleep tonight. The patient was also given drops with which to begin a drop regimen today and will  follow-up with me in one day. Implant Name Type Inv. Item Serial No. Manufacturer Lot No. LRB No. Used Action  LENS IOL TECNIS EYHANCE 20.0 - [id number] Intraocular Lens LENS IOL TECNIS EYHANCE 20.0 [id number] JOHNSON   Right 1 Implanted   Procedure(s): CATARACT EXTRACTION PHACO AND INTRAOCULAR LENS PLACEMENT (IOC) RIGHT 11.27 01:01.9 (Right)  Electronically signed: Birder Robson 03/17/2020 9:11 AM

## 2020-03-17 NOTE — Transfer of Care (Signed)
Immediate Anesthesia Transfer of Care Note  Patient: Suzanne Morgan  Procedure(s) Performed: CATARACT EXTRACTION PHACO AND INTRAOCULAR LENS PLACEMENT (IOC) RIGHT 11.27 01:01.9 (Right Eye)  Patient Location: PACU  Anesthesia Type: MAC  Level of Consciousness: awake, alert  and patient cooperative  Airway and Oxygen Therapy: Patient Spontanous Breathing and Patient connected to supplemental oxygen  Post-op Assessment: Post-op Vital signs reviewed, Patient's Cardiovascular Status Stable, Respiratory Function Stable, Patent Airway and No signs of Nausea or vomiting  Post-op Vital Signs: Reviewed and stable  Complications: No complications documented.

## 2020-03-17 NOTE — H&P (Signed)
[facility]   Primary Care Physician:  Birdie Sons, MD Ophthalmologist: Dr. George Ina  Pre-Procedure History & Physical: HPI:  Suzanne Morgan is a 80 y.o. female here for cataract surgery.   Past Medical History:  Diagnosis Date  . Herpes zoster without complication 57/26/2035  . History of chicken pox   . History of mumps   . Hypertension   . Hypothyroidism   . Mitral valve prolapse   . Squamous cell carcinoma    right lower leg. excised by Dr. Aubery Lapping    Past Surgical History:  Procedure Laterality Date  . BREAST CYST ASPIRATION Right many yrs ago   negative  . CATARACT EXTRACTION W/PHACO Left 03/03/2020   Procedure: CATARACT EXTRACTION PHACO AND INTRAOCULAR LENS PLACEMENT (IOC) LEFT 4.84 00:40.3 ;  Surgeon: Birder Robson, MD;  Location: [facility];  Service: Ophthalmology;  Laterality: Left;  . COLONOSCOPY WITH PROPOFOL N/A 11/30/2017   Procedure: COLONOSCOPY WITH PROPOFOL;  Surgeon: Virgel Manifold, MD;  Location: [facility];  Service: Endoscopy;  Laterality: N/A;  . SQUAMOUS CELL CARCINOMA EXCISION Right    Right lateral lower leg Dr. Aubery Lapping  . THYROIDECTOMY    . UPPER GI ENDOSCOPY  06/13/07   HIATAL HERNIA, IRREGEULAR Z-LINE    Prior to Admission medications   Medication Sig Start Date End Date Taking? Authorizing Provider  amLODipine (NORVASC) 2.5 MG tablet TAKE 1 TABLET BY MOUTH EVERY DAY 06/05/19  Yes Birdie Sons, MD  calcium carbonate (OS-CAL) 600 MG TABS tablet Take 600 mg by mouth daily with breakfast.  12/26/06  Yes [provider]  levothyroxine (SYNTHROID) 100 MCG tablet TAKE 1 TABLET BY MOUTH EVERY DAY 05/19/19  Yes Birdie Sons, MD  Multiple Vitamin tablet Take 1 tablet by mouth daily.  12/13/05  Yes [provider]    Allergies as of 01/14/2020 - Review Complete 10/25/2019  Allergen Reaction Noted  . Clindamycin/lincomycin Diarrhea 09/19/2017  . Sulfa antibiotics  03/05/2015  . Other  Rash 03/05/2015    Family History  Problem Relation Age of Onset  . Stroke Mother   . Heart attack Father     Social History   Socioeconomic History  . Marital status: Married    Spouse name: Not on file  . Number of children: 1  . Years of education: 2 adopted children  . Highest education level: Bachelor's degree (e.g., BA, AB, BS)  Occupational History  . Not on file  Tobacco Use  . Smoking status: Never Smoker  . Smokeless tobacco: Never Used  Vaping Use  . Vaping Use: Never used  Substance and Sexual Activity  . Alcohol use: Yes    Alcohol/week: 1.0 - 2.0 standard drink    Types: 1 - 2 Glasses of wine per week  . Drug use: No  . Sexual activity: Not on file  Other Topics Concern  . Not on file  Social History Narrative   Pt as 1 biological child and 2 adopted.   Social Determinants of Health   Financial Resource Strain: Not on file  Food Insecurity: Not on file  Transportation Needs: Not on file  Physical Activity: Not on file  Stress: Not on file  Social Connections: Not on file  Intimate Partner Violence: Not on file    Review of Systems: See HPI, otherwise negative ROS  Physical Exam: BP (!) 172/88   Pulse 98   Temp 97.6 F (36.4 C) (Temporal)   Ht 5\' 1"  (1.549 m)  Wt 49.4 kg   SpO2 98%   BMI 20.60 kg/m  General:   Alert,  pleasant and cooperative in NAD Head:  Normocephalic and atraumatic. Respiratory:  Normal work of breathing.  Impression/Plan: Suzanne Morgan is here for cataract surgery.  Risks, benefits, limitations, and alternatives regarding cataract surgery have been reviewed with the patient.  Questions have been answered.  All parties agreeable.   Birder Robson, MD  03/17/2020, 8:45 AM

## 2020-03-17 NOTE — Anesthesia Procedure Notes (Signed)
Procedure Name: MAC Date/Time: 03/17/2020 8:52 AM Performed by: Cameron Ali, CRNA Pre-anesthesia Checklist: Patient identified, Emergency Drugs available, Suction available, Timeout performed and Patient being monitored Patient Re-evaluated:Patient Re-evaluated prior to induction Oxygen Delivery Method: Nasal cannula Placement Confirmation: positive ETCO2

## 2020-03-17 NOTE — Anesthesia Postprocedure Evaluation (Signed)
Anesthesia Post Note  Patient: Suzanne Morgan  Procedure(s) Performed: CATARACT EXTRACTION PHACO AND INTRAOCULAR LENS PLACEMENT (IOC) RIGHT 11.27 01:01.9 (Right Eye)     Patient location during evaluation: PACU Anesthesia Type: MAC Level of consciousness: awake and alert Pain management: pain level controlled Vital Signs Assessment: post-procedure vital signs reviewed and stable Respiratory status: spontaneous breathing and nonlabored ventilation Cardiovascular status: blood pressure returned to baseline Postop Assessment: no apparent nausea or vomiting Anesthetic complications: no   No complications documented.  [place] [employer]

## 2020-03-17 NOTE — Anesthesia Preprocedure Evaluation (Signed)
Anesthesia Evaluation  Patient identified by MRN, date of birth, ID band Patient awake    Reviewed: Allergy & Precautions, H&P , NPO status , Patient's Chart, lab work & pertinent test results  Airway Mallampati: II  TM Distance: >3 FB Neck ROM: full    Dental no notable dental hx.    Pulmonary neg pulmonary ROS,    Pulmonary exam normal        Cardiovascular hypertension, On Medications Normal cardiovascular exam     Neuro/Psych negative neurological ROS  negative psych ROS   GI/Hepatic negative GI ROS, Neg liver ROS,   Endo/Other  Hypothyroidism   Renal/GU      Musculoskeletal   Abdominal Normal abdominal exam  (+)   Peds  Hematology negative hematology ROS (+)   Anesthesia Other Findings   Reproductive/Obstetrics                             Anesthesia Physical  Anesthesia Plan  ASA: II  Anesthesia Plan: MAC   Post-op Pain Management:    Induction: Intravenous  PONV Risk Score and Plan: 2 and TIVA, Midazolam and Treatment may vary due to age or medical condition  Airway Management Planned:   Additional Equipment: None  Intra-op Plan:   Post-operative Plan:   Informed Consent: I have reviewed the patients History and Physical, chart, labs and discussed the procedure including the risks, benefits and alternatives for the proposed anesthesia with the patient or authorized representative who has indicated his/her understanding and acceptance.     Dental advisory given  Plan Discussed with: CRNA  Anesthesia Plan Comments:         Anesthesia Quick Evaluation

## 2020-03-18 ENCOUNTER — Encounter: Payer: Self-pay | Admitting: Ophthalmology

## 2020-04-27 DIAGNOSIS — Z961 Presence of intraocular lens: Secondary | ICD-10-CM | POA: Diagnosis not present

## 2020-04-30 ENCOUNTER — Other Ambulatory Visit: Payer: Self-pay | Admitting: Family Medicine

## 2020-04-30 DIAGNOSIS — Z1231 Encounter for screening mammogram for malignant neoplasm of breast: Secondary | ICD-10-CM

## 2020-05-04 ENCOUNTER — Other Ambulatory Visit: Payer: Self-pay

## 2020-05-04 ENCOUNTER — Ambulatory Visit
Admission: RE | Admit: 2020-05-04 | Discharge: 2020-05-04 | Disposition: A | Discharge status: Home / Self Care | Payer: Medicare HMO | Source: Ambulatory Visit | Attending: Family Medicine | Admitting: Family Medicine

## 2020-05-04 DIAGNOSIS — Z1231 Encounter for screening mammogram for malignant neoplasm of breast: Secondary | ICD-10-CM | POA: Diagnosis not present

## 2020-05-23 ENCOUNTER — Other Ambulatory Visit: Payer: Self-pay | Admitting: Family Medicine

## 2020-05-23 DIAGNOSIS — I10 Essential (primary) hypertension: Secondary | ICD-10-CM

## 2020-05-23 NOTE — Telephone Encounter (Signed)
Requested Prescriptions  Pending Prescriptions Disp Refills  . amLODipine (NORVASC) 2.5 MG tablet [Pharmacy Med Name: AMLODIPINE BESYLATE 2.5 MG TAB] 90 tablet 0    Sig: TAKE 1 TABLET BY MOUTH EVERY DAY     Cardiovascular:  Calcium Channel Blockers Passed - 05/23/2020  9:31 AM      Passed - Last BP in normal range    BP Readings from Last 1 Encounters:  03/17/20 106/68         Passed - Valid encounter within last 6 months    Recent Outpatient Visits          3 months ago Fall, initial encounter   [place], PA-C   7 months ago Annual physical exam   [facility] Birdie Sons, MD   1 year ago Hypothyroidism, postop   [place], Wendee Beavers, [place]   1 year ago Sore throat   [place] Flinchum, Kelby Aline, FNP   2 years ago Change in stool caliber   [facility] Birdie Sons, MD

## 2020-08-04 ENCOUNTER — Other Ambulatory Visit: Payer: Self-pay | Admitting: Family Medicine

## 2020-08-04 NOTE — Telephone Encounter (Signed)
Requested medication (s) are due for refill today: expired medication  Requested medication (s) are on the active medication list: yes  Last refill:  05/19/19 #90 4 refills  Future visit scheduled: no  Notes to clinic:  expired medication. Do you want to renew Rx?     Requested Prescriptions  Pending Prescriptions Disp Refills   levothyroxine (SYNTHROID) 100 MCG tablet [Pharmacy Med Name: LEVOTHYROXINE 100 MCG TABLET] 90 tablet 4    Sig: TAKE 1 TABLET BY MOUTH EVERY DAY      Endocrinology:  Hypothyroid Agents Failed - 08/04/2020  4:40 PM      Failed - TSH needs to be rechecked within 3 months after an abnormal result. Refill until TSH is due.      Passed - TSH in normal range and within 360 days    TSH  Date Value Ref Range Status  10/30/2019 1.220 0.450 - 4.500 uIU/mL Final          Passed - Valid encounter within last 12 months    Recent Outpatient Visits           6 months ago Fall, initial encounter   [place], PA-C   9 months ago Annual physical exam   [facility] Birdie Sons, MD   1 year ago Hypothyroidism, postop   [place], Wendee Beavers, [place]   1 year ago Sore throat   [facility] Flinchum, Kelby Aline, FNP   3 years ago Change in stool caliber   [facility] Birdie Sons, MD

## 2020-08-18 ENCOUNTER — Other Ambulatory Visit: Payer: Self-pay | Admitting: Family Medicine

## 2020-08-18 DIAGNOSIS — I10 Essential (primary) hypertension: Secondary | ICD-10-CM

## 2020-08-18 NOTE — Telephone Encounter (Signed)
Requested Prescriptions  Pending Prescriptions Disp Refills  . amLODipine (NORVASC) 2.5 MG tablet [Pharmacy Med Name: AMLODIPINE BESYLATE 2.5 MG TAB] 90 tablet 0    Sig: TAKE 1 TABLET BY MOUTH EVERY DAY     Cardiovascular:  Calcium Channel Blockers Failed - 08/18/2020  1:34 AM      Failed - Valid encounter within last 6 months    Recent Outpatient Visits          6 months ago Fall, initial encounter   [place], PA-C   9 months ago Annual physical exam   [facility] Birdie Sons, MD   1 year ago Hypothyroidism, postop   [place], Wendee Beavers, [place]   1 year ago Sore throat   [place], Kelby Aline, [place]   3 years ago Change in stool caliber   [place], MD             Passed - Last BP in normal range    BP Readings from Last 1 Encounters:  03/17/20 106/68

## 2020-10-14 DIAGNOSIS — C44101 Unspecified malignant neoplasm of skin of unspecified eyelid, including canthus: Secondary | ICD-10-CM | POA: Diagnosis not present

## 2020-11-04 ENCOUNTER — Other Ambulatory Visit: Payer: Self-pay | Admitting: Family Medicine

## 2020-11-04 DIAGNOSIS — I10 Essential (primary) hypertension: Secondary | ICD-10-CM

## 2020-11-20 ENCOUNTER — Other Ambulatory Visit: Payer: Self-pay

## 2020-11-20 ENCOUNTER — Ambulatory Visit (INDEPENDENT_AMBULATORY_CARE_PROVIDER_SITE_OTHER): Payer: Medicare HMO | Admitting: Family Medicine

## 2020-11-20 ENCOUNTER — Encounter: Payer: Self-pay | Admitting: Family Medicine

## 2020-11-20 VITALS — BP 134/74 | HR 84 | Temp 98.6°F | Resp 16 | Ht 61.0 in | Wt 108.6 lb

## 2020-11-20 DIAGNOSIS — I10 Essential (primary) hypertension: Secondary | ICD-10-CM | POA: Diagnosis not present

## 2020-11-20 DIAGNOSIS — Z0000 Encounter for general adult medical examination without abnormal findings: Secondary | ICD-10-CM

## 2020-11-20 NOTE — Progress Notes (Signed)
I,Suzanne Morgan,acting as a scribe for Lelon Huh, MD.,have documented all relevant documentation on the behalf of Lelon Huh, MD,as directed by  Lelon Huh, MD while in the presence of Lelon Huh, MD.  Complete Physical Exam      Patient: Suzanne Morgan, Female    DOB: 1940/05/07, 80 y.o.   MRN: [id number] Visit Date: 11/20/2020  Today's Provider: Lelon Huh, MD    Subjective    Suzanne Morgan is a 80 y.o. female who presents today for her complete physical examination. She reports consuming a general diet. Home exercise routine includes walking x5 days a week. She generally feels well. She reports sleeping fairly well. She does not have additional problems to discuss today.   HPI  She is generally doing well. Is tolerating amlodipine, although she has noticed having to get up at night a few times to void since she has been taking it.   Medications: Outpatient Medications Prior to Visit  Medication Sig   amLODipine (NORVASC) 2.5 MG tablet TAKE 1 TABLET BY MOUTH EVERY DAY   calcium carbonate (OS-CAL) 600 MG TABS tablet Take 600 mg by mouth daily with breakfast.    levothyroxine (SYNTHROID) 100 MCG tablet TAKE 1 TABLET BY MOUTH EVERY DAY   Multiple Vitamin tablet Take 1 tablet by mouth daily.    No facility-administered medications prior to visit.    Allergies  Allergen Reactions   Clindamycin/Lincomycin Diarrhea   Sulfa Antibiotics    Vancomycin     C-Diff   Other Rash    KAPIDEX     Patient Care Team: Birdie Sons, MD as PCP - General (Family Medicine) [place], [place] as Consulting Physician (Optometry) Benitez-Graham, Cori Razor, MD (Dermatology) Virgel Manifold, MD as Consulting Physician (Gastroenterology)  Review of Systems  Eyes:  Positive for photophobia.  Musculoskeletal:  Positive for arthralgias and myalgias.  Psychiatric/Behavioral:  Positive for sleep disturbance.   All other systems reviewed and are negative.  {Labs   Heme  Chem  Endocrine  Serology  Results Review (optional):[id number]}    Objective    Vitals: BP 134/74   Pulse 84   Temp 98.6 F (37 C) (Temporal)   Resp 16   Ht 5\' 1"  (1.549 m)   Wt 108 lb 9.6 oz (49.3 kg)   SpO2 98%   BMI 20.52 kg/m    Physical Exam  General Appearance:    Well developed, well nourished female. Alert, cooperative, in no acute distress, appears stated age   Head:    Normocephalic, without obvious abnormality, atraumatic  Eyes:    PERRL, conjunctiva/corneas clear, EOM's intact, fundi    benign, both eyes  Ears:    Normal TM's and external ear canals, both ears  Neck:   Supple, symmetrical, trachea midline, no adenopathy;    thyroid:  no enlargement/tenderness/nodules; no carotid   bruit or JVD  Back:     Symmetric, no curvature, ROM normal, no CVA tenderness  Lungs:     Clear to auscultation bilaterally, respirations unlabored  Chest Wall:    No tenderness or deformity   Heart:    Normal heart rate. Normal rhythm. No murmurs, rubs, or gallops.    Breast Exam:    deferred  Abdomen:     Soft, non-tender, bowel sounds active all four quadrants,    no masses, no organomegaly  Pelvic:    deferred  Extremities:   All extremities are intact. No cyanosis or edema  Pulses:  2+ and symmetric all extremities  Skin:   Skin color, texture, turgor normal, no rashes or lesions  Lymph nodes:   Cervical, supraclavicular, and axillary nodes normal  Neurologic:   CNII-XII intact, normal strength, sensation and reflexes    throughout    EKG: No acute changes  Assessment & Plan     1. Annual physical exam Normal exam. Doing very well  2. Primary hypertension Doing well with low lose amlodipine - CBC - Comprehensive metabolic panel - TSH - EKG 12-Lead   Discussed health benefits of physical activity, and encouraged her to engage in regular exercise appropriate for her age and condition.      The entirety of the information documented in the History of Present  Illness, Review of Systems and Physical Exam were personally obtained by me. Portions of this information were initially documented by the CMA and reviewed by me for thoroughness and accuracy.     Lelon Huh, MD  [facility] [phone] (phone) [phone] (fax)  [place]

## 2020-11-20 NOTE — Progress Notes (Signed)
Annual Wellness Visit     Patient: Suzanne Morgan, Female    DOB: August 05, 1940, 80 y.o.   MRN: [id number] Visit Date: 11/20/2020  Today's Provider: Lelon Huh, MD    Subjective    Suzanne Morgan is a 80 y.o. female who presents today for her Annual Wellness Visit.   Medications: Outpatient Medications Prior to Visit  Medication Sig   amLODipine (NORVASC) 2.5 MG tablet TAKE 1 TABLET BY MOUTH EVERY DAY   calcium carbonate (OS-CAL) 600 MG TABS tablet Take 600 mg by mouth daily with breakfast.    levothyroxine (SYNTHROID) 100 MCG tablet TAKE 1 TABLET BY MOUTH EVERY DAY   Multiple Vitamin tablet Take 1 tablet by mouth daily.    No facility-administered medications prior to visit.    Allergies  Allergen Reactions   Clindamycin/Lincomycin Diarrhea   Sulfa Antibiotics    Vancomycin     C-Diff   Other Rash    KAPIDEX     Patient Care Team: Birdie Sons, MD as PCP - General (Family Medicine) [place], [employer] as Consulting Physician (Optometry) Benitez-Graham, Cori Razor, MD (Dermatology) Virgel Manifold, MD as Consulting Physician (Gastroenterology)        Objective     Most recent functional status assessment: In your present state of health, do you have any difficulty performing the following activities: 11/20/2020  Hearing? N  Vision? N  Difficulty concentrating or making decisions? N  Walking or climbing stairs? N  Dressing or bathing? N  Doing errands, shopping? N  Some recent data might be hidden   Most recent fall risk assessment: Fall Risk  11/20/2020  Falls in the past year? 1  Number falls in past yr: 0  Injury with Fall? 0  Risk for fall due to : History of fall(s)  Follow up Falls evaluation completed    Most recent depression screenings: PHQ 2/9 Scores 11/20/2020 10/25/2019  PHQ - 2 Score 0 0  PHQ- 9 Score 1 1   Most recent cognitive screening: 6CIT Screen 09/12/2018  What Year? 0 points  What month? 0 points  What time? 0  points  Count back from 20 0 points  Months in reverse 0 points  Repeat phrase 0 points  Total Score 0   Most recent Audit-C alcohol use screening Alcohol Use Disorder Test (AUDIT) 11/20/2020  1. How often do you have a drink containing alcohol? 1  2. How many drinks containing alcohol do you have on a typical day when you are drinking? 0  3. How often do you have six or more drinks on one occasion? 0  AUDIT-C Score 1  4. How often during the last year have you found that you were not able to stop drinking once you had started? -  5. How often during the last year have you failed to do what was normally expected from you because of drinking? -  6. How often during the last year have you needed a first drink in the morning to get yourself going after a heavy drinking session? -  7. How often during the last year have you had a feeling of guilt of remorse after drinking? -  8. How often during the last year have you been unable to remember what happened the night before because you had been drinking? -  9. Have you or someone else been injured as a result of your drinking? -  10. Has a relative or friend or a  doctor or another health worker been concerned about your drinking or suggested you cut down? -  Alcohol Use Disorder Identification Test Final Score (AUDIT) -  Alcohol Brief Interventions/Follow-up -   A score of 3 or more in women, and 4 or more in men indicates increased risk for alcohol abuse, EXCEPT if all of the points are from question 1   No results found for any visits on 11/20/20.  Assessment & Plan     Annual wellness visit done today including the all of the following: Reviewed patient's Family Medical History Reviewed and updated list of patient's medical providers Assessment of cognitive impairment was done Assessed patient's functional ability Established a written schedule for health screening [place] Completed and Reviewed  Exercise  Activities and Dietary recommendations  Goals      DIET - INCREASE WATER INTAKE     Recommend increasing water intake to 4 glasses a day.        Immunization History  Administered Date(s) Administered   Fluad Quad(high Dose 65+) 10/23/2018   Hepatitis B 10/11/1991, 11/13/1991, 04/09/1992   Influenza Split 11/02/2006   Influenza, High Dose Seasonal PF 10/30/2015, 10/19/2017, 10/09/2019, 10/15/2020   Influenza-Unspecified 11/14/2016   PFIZER Comirnaty(Gray Top)Covid-19 Tri-Sucrose Vaccine 05/21/2020   PFIZER(Purple Top)SARS-COV-2 Vaccination 01/31/2019, 02/21/2019, 11/07/2019   Pfizer Covid-19 Vaccine Bivalent Booster 5yrs & up 11/04/2020   Pneumococcal Conjugate-13 03/20/2014   Pneumococcal Polysaccharide-23 11/19/2003, 12/27/2010   Td 12/09/2008   Tdap 02/17/2020   Zoster Recombinat (Shingrix) 04/13/2020, 09/17/2020   Zoster, Live 02/08/2006    Health Maintenance  Topic Date Due   DEXA SCAN  04/29/2022   COLONOSCOPY (Pts 45-73yrs Insurance coverage will need to be confirmed)  12/01/2022   TETANUS/TDAP  02/16/2030   Pneumonia Vaccine 23+ Years old  Completed   INFLUENZA VACCINE  Completed   COVID-19 Vaccine  Completed   Zoster Vaccines- Shingrix  Completed   HPV VACCINES  Aged Out     Discussed health benefits of physical activity, and encouraged her to engage in regular exercise appropriate for her age and condition.           The entirety of the information documented in the History of Present Illness, Review of Systems and Physical Exam were personally obtained by me. Portions of this information were initially documented by the CMA and reviewed by me for thoroughness and accuracy.     Lelon Huh, MD  [facility] [phone] (phone) [phone] (fax)  [place]

## 2020-11-21 LAB — COMPREHENSIVE METABOLIC PANEL
ALT: 25 IU/L (ref 0–32)
AST: 30 IU/L (ref 0–40)
Albumin/Globulin Ratio: 2.1 (ref 1.2–2.2)
Albumin: 4.8 g/dL — ABNORMAL HIGH (ref 3.6–4.6)
Alkaline Phosphatase: 82 IU/L (ref 44–121)
BUN/Creatinine Ratio: 17 (ref 12–28)
BUN: 16 mg/dL (ref 8–27)
Bilirubin Total: 0.6 mg/dL (ref 0.0–1.2)
CO2: 27 mmol/L (ref 20–29)
Calcium: 10.1 mg/dL (ref 8.7–10.3)
Chloride: 99 mmol/L (ref 96–106)
Creatinine, Ser: 0.92 mg/dL (ref 0.57–1.00)
Globulin, Total: 2.3 g/dL (ref 1.5–4.5)
Glucose: 103 mg/dL — ABNORMAL HIGH (ref 70–99)
Potassium: 5.4 mmol/L — ABNORMAL HIGH (ref 3.5–5.2)
Sodium: 139 mmol/L (ref 134–144)
Total Protein: 7.1 g/dL (ref 6.0–8.5)
eGFR: 63 mL/min/{1.73_m2} (ref >59)

## 2020-11-21 LAB — CBC
Hematocrit: 42.4 % (ref 34.0–46.6)
Hemoglobin: 14.4 g/dL (ref 11.1–15.9)
MCH: 30.3 pg (ref 26.6–33.0)
MCHC: 34 g/dL (ref 31.5–35.7)
MCV: 89 fL (ref 79–97)
Platelets: 156 10*3/uL (ref 150–450)
RBC: 4.76 x10E6/uL (ref 3.77–5.28)
RDW: 11.1 % — ABNORMAL LOW (ref 11.7–15.4)
WBC: 7.2 10*3/uL (ref 3.4–10.8)

## 2020-11-21 LAB — TSH: TSH: 1.43 u[IU]/mL (ref 0.450–4.500)

## 2021-01-27 DIAGNOSIS — L570 Actinic keratosis: Secondary | ICD-10-CM | POA: Diagnosis not present

## 2021-01-27 DIAGNOSIS — L578 Other skin changes due to chronic exposure to nonionizing radiation: Secondary | ICD-10-CM | POA: Diagnosis not present

## 2021-01-27 DIAGNOSIS — L300 Nummular dermatitis: Secondary | ICD-10-CM | POA: Diagnosis not present

## 2021-01-27 DIAGNOSIS — Z872 Personal history of diseases of the skin and subcutaneous tissue: Secondary | ICD-10-CM | POA: Diagnosis not present

## 2021-01-27 DIAGNOSIS — Z859 Personal history of malignant neoplasm, unspecified: Secondary | ICD-10-CM | POA: Diagnosis not present

## 2021-01-31 ENCOUNTER — Other Ambulatory Visit: Payer: Self-pay | Admitting: Family Medicine

## 2021-01-31 DIAGNOSIS — I10 Essential (primary) hypertension: Secondary | ICD-10-CM

## 2021-01-31 NOTE — Telephone Encounter (Signed)
Requested Prescriptions  Pending Prescriptions Disp Refills   amLODipine (NORVASC) 2.5 MG tablet [Pharmacy Med Name: AMLODIPINE BESYLATE 2.5 MG TAB] 90 tablet 1    Sig: TAKE 1 TABLET BY MOUTH EVERY DAY     Cardiovascular:  Calcium Channel Blockers Passed - 01/31/2021  9:28 AM      Passed - Last BP in normal range    BP Readings from Last 1 Encounters:  11/20/20 134/74         Passed - Valid encounter within last 6 months    Recent Outpatient Visits          2 months ago Annual physical exam   [facility] Birdie Sons, MD   1 year ago Fall, initial encounter   [facility], Vickki Muff, PA-C   1 year ago Annual physical exam   [facility] Birdie Sons, MD   1 year ago Hypothyroidism, postop   [place], [place], [place]   2 years ago Sore throat   [place], Kelby Aline, FNP

## 2021-02-04 DIAGNOSIS — D22112 Melanocytic nevi of right lower eyelid, including canthus: Secondary | ICD-10-CM | POA: Diagnosis not present

## 2021-02-04 DIAGNOSIS — C441021 Unspecified malignant neoplasm of skin of right upper eyelid, including canthus: Secondary | ICD-10-CM | POA: Diagnosis not present

## 2021-02-04 DIAGNOSIS — C44101 Unspecified malignant neoplasm of skin of unspecified eyelid, including canthus: Secondary | ICD-10-CM | POA: Diagnosis not present

## 2021-03-26 ENCOUNTER — Other Ambulatory Visit: Payer: Self-pay | Admitting: Family Medicine

## 2021-03-26 DIAGNOSIS — Z1231 Encounter for screening mammogram for malignant neoplasm of breast: Secondary | ICD-10-CM

## 2021-05-12 ENCOUNTER — Ambulatory Visit
Admission: RE | Admit: 2021-05-12 | Discharge: 2021-05-12 | Disposition: A | Discharge status: Home / Self Care | Payer: Medicare HMO | Source: Ambulatory Visit | Attending: Family Medicine | Admitting: Family Medicine

## 2021-05-12 DIAGNOSIS — Z1231 Encounter for screening mammogram for malignant neoplasm of breast: Secondary | ICD-10-CM | POA: Diagnosis not present

## 2021-06-08 ENCOUNTER — Other Ambulatory Visit: Payer: Self-pay | Admitting: Family Medicine

## 2021-06-08 DIAGNOSIS — I10 Essential (primary) hypertension: Secondary | ICD-10-CM

## 2021-06-17 DIAGNOSIS — H02831 Dermatochalasis of right upper eyelid: Secondary | ICD-10-CM | POA: Diagnosis not present

## 2021-06-17 DIAGNOSIS — H43813 Vitreous degeneration, bilateral: Secondary | ICD-10-CM | POA: Diagnosis not present

## 2021-09-28 ENCOUNTER — Telehealth: Payer: Self-pay | Admitting: Family Medicine

## 2021-09-28 NOTE — Telephone Encounter (Signed)
Patient wanting to know if she should have the RSV Vaccine and if the provider is recommending it to patients now.

## 2021-09-29 IMAGING — MG DIGITAL SCREENING BILAT W/ TOMO W/ CAD
8 series · 9 of 24 positions shown · non-contrast
Comparison: Previous exam(s).

CLINICAL DATA: Screening.

EXAM:
DIGITAL SCREENING BILATERAL MAMMOGRAM WITH TOMO AND CAD

[R MLO synth-2D]
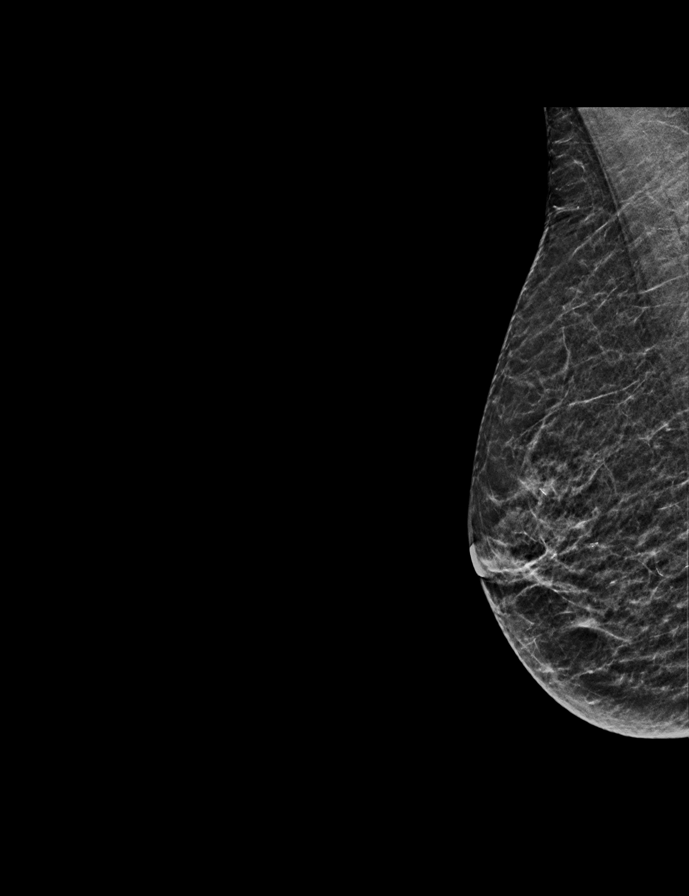

[R CC synth-2D]
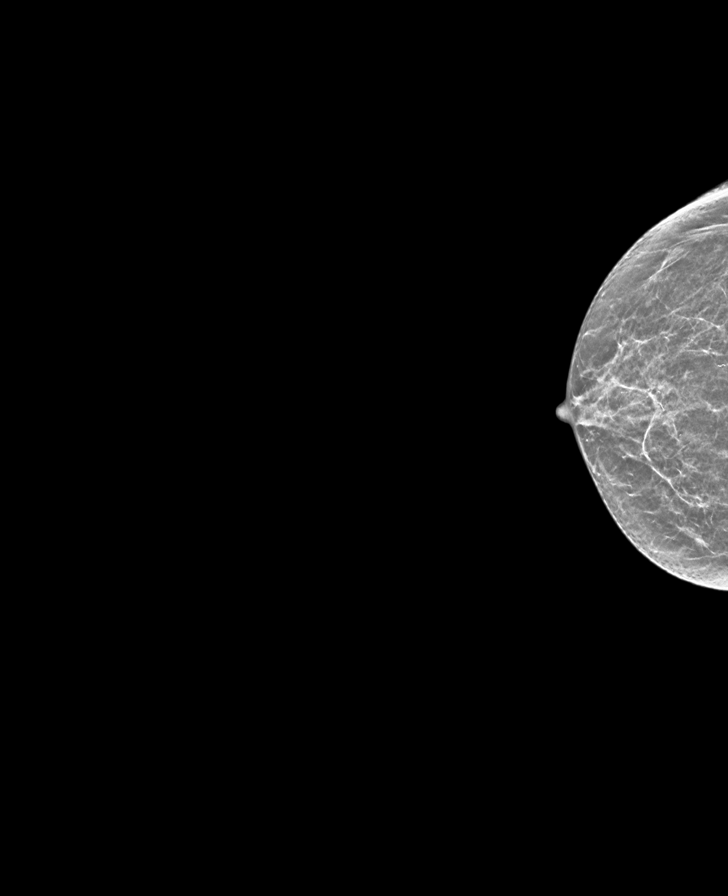

[L MLO synth-2D]
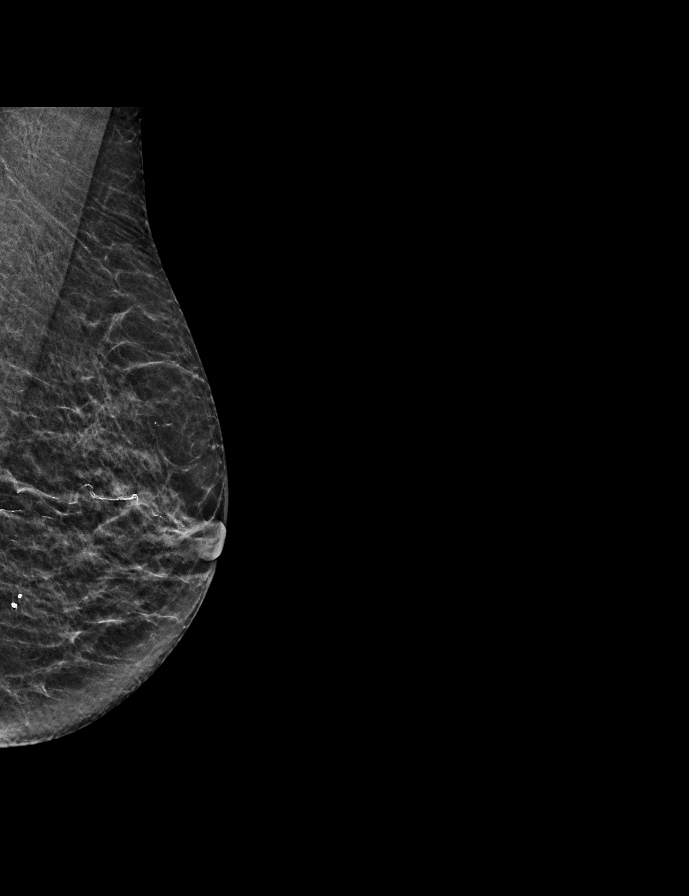

[L CC synth-2D]
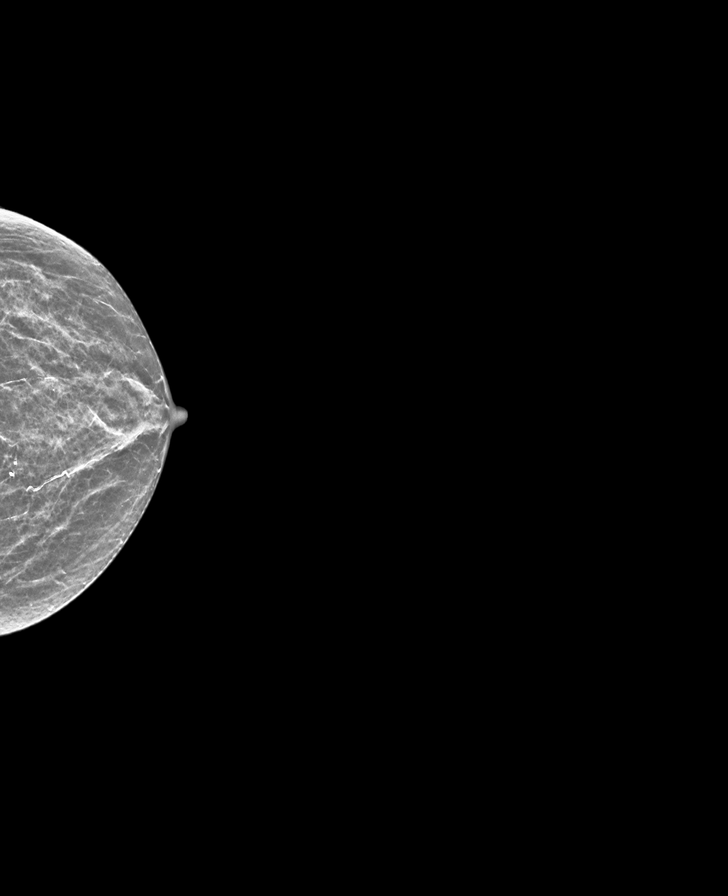

[R MLO tomo · 2 of 31 frames shown]
[frame 11/31]
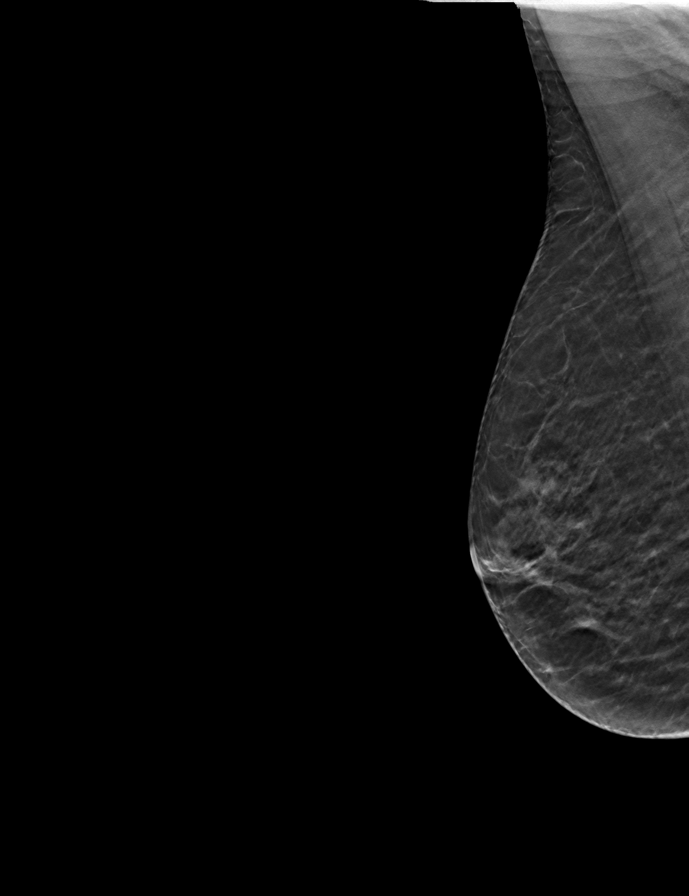
[frame 16/31]
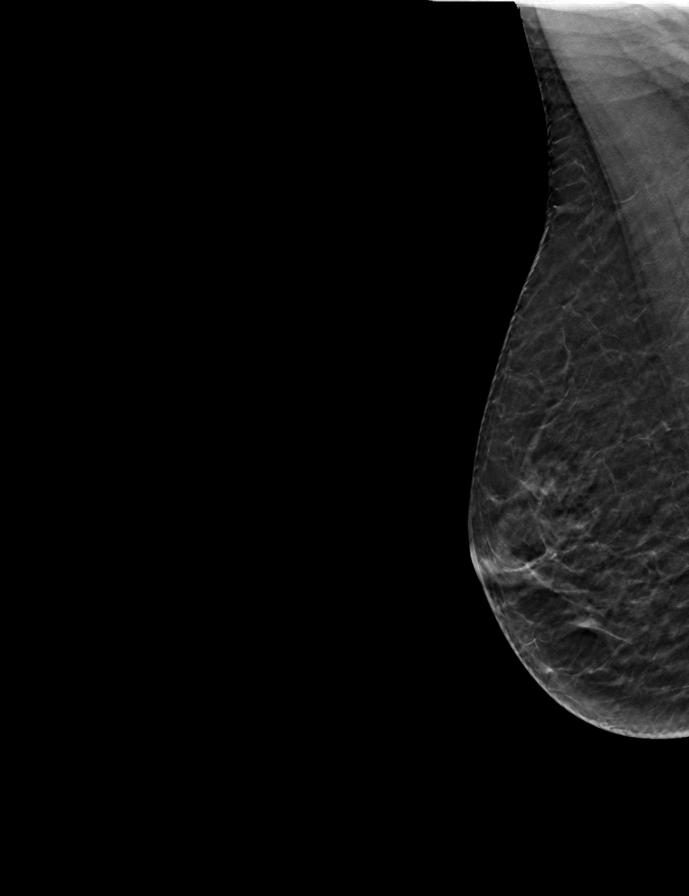

[L CC tomo · tomo slice 17/34.0]
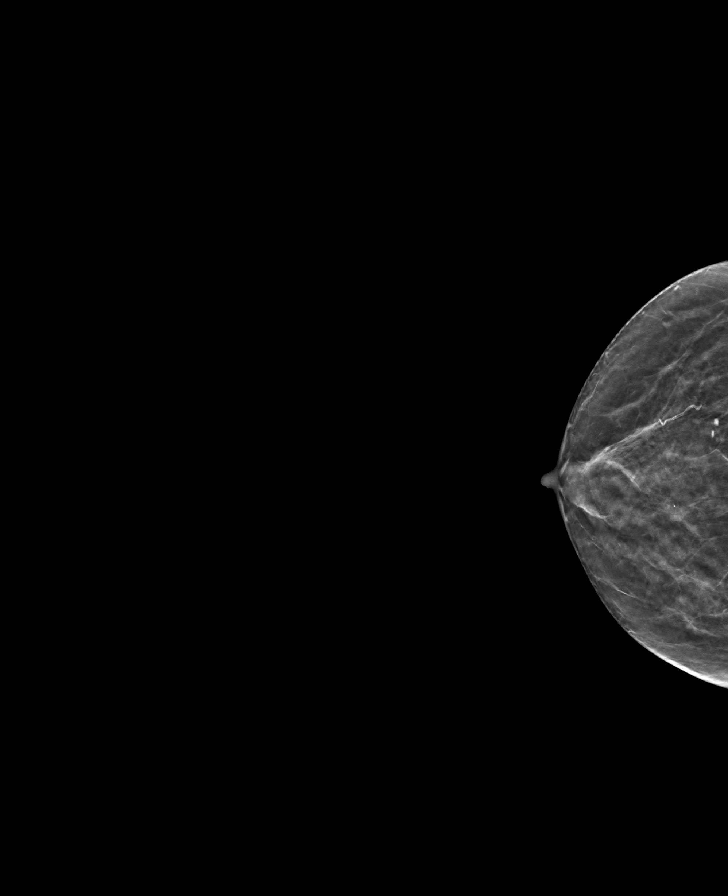

[L MLO tomo · tomo slice 19/37.0]
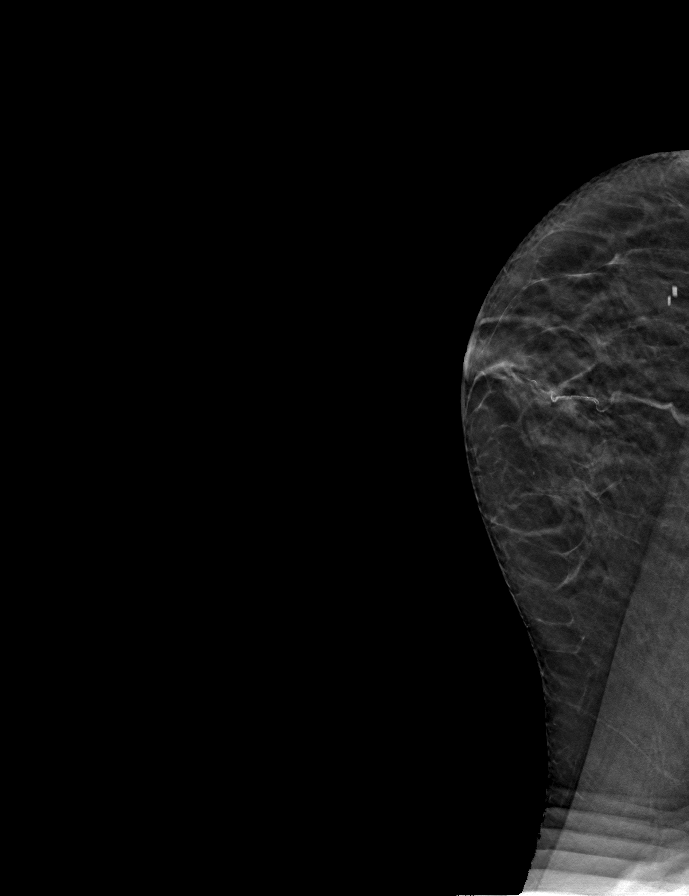

[R CC tomo · tomo slice 17/32.0]
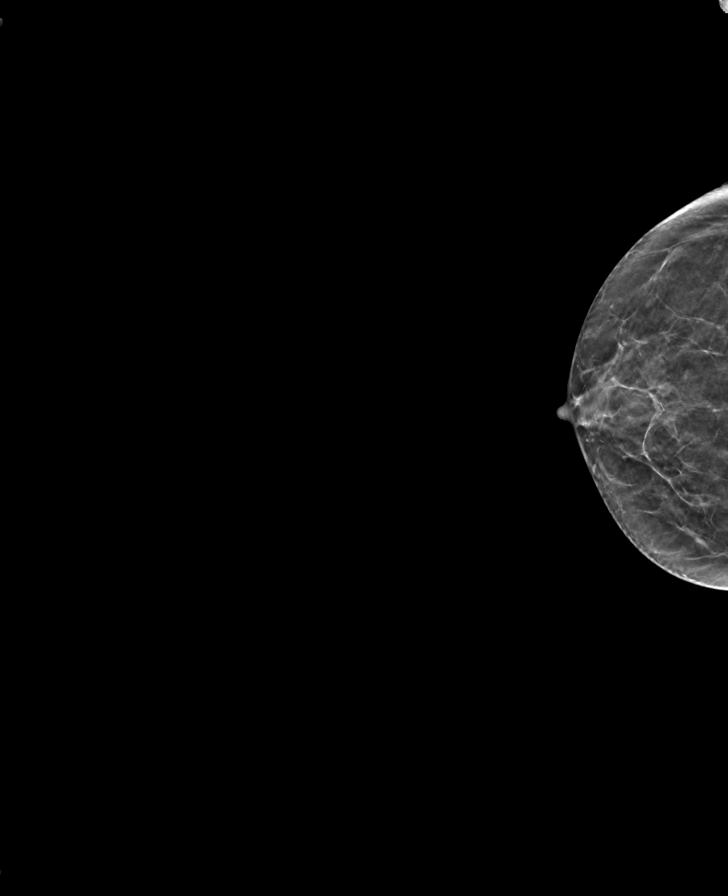

[9 of 24 positions shown; findings below may reference images not displayed]

ACR Breast Density Category b: There are scattered areas of
fibroglandular density.
FINDINGS: There are no findings suspicious for malignancy. Images were
processed with CAD.
IMPRESSION: No mammographic evidence of malignancy. A result letter of this
screening mammogram will be mailed directly to the patient.

RECOMMENDATION:
Screening mammogram in one year. (Code:[id number])

BI-RADS CATEGORY  1: Negative.

## 2021-09-29 NOTE — Telephone Encounter (Signed)
I think it's a good idea. There are no definitive recommendations or guidelines for adults, but is approved for people over. She would have to get it from a pharmacy and will need to check with pharmacist regarding insurance coverage.

## 2021-09-29 NOTE — Telephone Encounter (Signed)
Pt advised and agreeable

## 2021-10-14 DIAGNOSIS — H26493 Other secondary cataract, bilateral: Secondary | ICD-10-CM | POA: Diagnosis not present

## 2021-10-14 DIAGNOSIS — H43813 Vitreous degeneration, bilateral: Secondary | ICD-10-CM | POA: Diagnosis not present

## 2021-10-27 ENCOUNTER — Other Ambulatory Visit: Payer: Self-pay | Admitting: Family Medicine

## 2021-10-27 NOTE — Telephone Encounter (Signed)
Requested Prescriptions  Pending Prescriptions Disp Refills  . levothyroxine (SYNTHROID) 100 MCG tablet [Pharmacy Med Name: LEVOTHYROXINE 100 MCG TABLET] 90 tablet 4    Sig: TAKE 1 TABLET BY MOUTH EVERY DAY     Endocrinology:  Hypothyroid Agents Passed - 10/27/2021  2:35 AM      Passed - TSH in normal range and within 360 days    TSH  Date Value Ref Range Status  11/20/2020 1.430 0.450 - 4.500 uIU/mL Final         Passed - Valid encounter within last 12 months    Recent Outpatient Visits          11 months ago Annual physical exam   [facility] Birdie Sons, MD   1 year ago Fall, initial encounter   [place], PA-C   2 years ago Annual physical exam   [facility] Birdie Sons, MD   2 years ago Hypothyroidism, postop   [place], [place], [place]   2 years ago Sore throat   [place], Kelby Aline, FNP

## 2021-11-22 ENCOUNTER — Encounter: Payer: Medicare HMO | Admitting: Family Medicine

## 2021-11-24 ENCOUNTER — Ambulatory Visit (INDEPENDENT_AMBULATORY_CARE_PROVIDER_SITE_OTHER): Payer: Medicare HMO | Admitting: Physician Assistant

## 2021-11-24 ENCOUNTER — Encounter: Payer: Self-pay | Admitting: Physician Assistant

## 2021-11-24 VITALS — BP 123/71 | HR 89 | Wt 104.4 lb

## 2021-11-24 DIAGNOSIS — I059 Rheumatic mitral valve disease, unspecified: Secondary | ICD-10-CM

## 2021-11-24 DIAGNOSIS — Z0000 Encounter for general adult medical examination without abnormal findings: Secondary | ICD-10-CM

## 2021-11-24 DIAGNOSIS — I10 Essential (primary) hypertension: Secondary | ICD-10-CM

## 2021-11-24 DIAGNOSIS — R200 Anesthesia of skin: Secondary | ICD-10-CM

## 2021-11-24 DIAGNOSIS — E785 Hyperlipidemia, unspecified: Secondary | ICD-10-CM

## 2021-11-24 DIAGNOSIS — R202 Paresthesia of skin: Secondary | ICD-10-CM

## 2021-11-24 DIAGNOSIS — R739 Hyperglycemia, unspecified: Secondary | ICD-10-CM | POA: Diagnosis not present

## 2021-11-24 DIAGNOSIS — E890 Postprocedural hypothyroidism: Secondary | ICD-10-CM

## 2021-11-24 NOTE — Assessment & Plan Note (Signed)
Audible murmur, pt reports distant history of echo. Denies sob, chest pain, fatigue, syncope

## 2021-11-24 NOTE — Assessment & Plan Note (Signed)
Elevated in office but normal range at home Continue medications Ordered cmp F/u 6 mo

## 2021-11-24 NOTE — Assessment & Plan Note (Signed)
Will check b12 but may be lumbar etiology

## 2021-11-24 NOTE — Progress Notes (Addendum)
I,Suzanne Morgan,acting as a Education administrator for [employer], PA-C.,have documented all relevant documentation on the behalf of Suzanne Kirschner, PA-C,as directed by  Suzanne Kirschner, PA-C while in the presence of Suzanne Kirschner, PA-C.   Annual Wellness Visit     Patient: Suzanne Morgan, Female    DOB: 1941-01-02, 81 y.o.   MRN: [id number] Visit Date: 11/24/2021  Today's Provider: Mikey Kirschner, PA-C  Cc. awv  Subjective    Suzanne Morgan is a 81 y.o. female who presents today for her Annual Wellness Visit. She reports consuming a general diet. Home exercise routine includes walking 1 hrs per day 5 days a week. She generally feels well. She reports sleeping fairly well. She does not have additional problems to discuss today.   She reports a pressure around her eyes when she goes from dark to light. Reports telling her eye doctor who said her exam was normal. Denies nasal congestion, allergy symptoms.  She reports a tingling sensation along the bottom b/l feet. Denies injury. Wonders if she needs a vitamin B12 supplement.   Medications: Outpatient Medications Prior to Visit  Medication Sig  . amLODipine (NORVASC) 2.5 MG tablet TAKE 1 TABLET BY MOUTH EVERY DAY  . calcium carbonate (OS-CAL) 600 MG TABS tablet Take 600 mg by mouth daily with breakfast.   . levothyroxine (SYNTHROID) 100 MCG tablet TAKE 1 TABLET BY MOUTH EVERY DAY  . Multiple Vitamin tablet Take 1 tablet by mouth daily.   Marland Kitchen AREXVY 120 MCG/0.5ML injection    No facility-administered medications prior to visit.    Allergies  Allergen Reactions  . Clindamycin/Lincomycin Diarrhea  . Sulfa Antibiotics   . Vancomycin     C-Diff  . Other Rash    KAPIDEX     Patient Care Team: Suzanne Sons, MD as PCP - General (Family Medicine) Pa, Suzanne Morgan as Consulting Physician (Optometry) Suzanne Edman, MD (Dermatology) Suzanne Manifold, MD (Inactive) as Consulting Physician (Gastroenterology)  Review of  Systems  Eyes:  Positive for photophobia and visual disturbance.  Musculoskeletal:  Positive for arthralgias.  Neurological:  Positive for light-headedness.      Objective    Blood pressure 123/71, pulse 89, weight 104 lb 6.4 oz (47.4 kg), SpO2 100 %.    Physical Exam Constitutional:      General: She is awake.     Appearance: She is well-developed.  HENT:     Head: Normocephalic.  Eyes:     Conjunctiva/sclera: Conjunctivae normal.  Cardiovascular:     Rate and Rhythm: Normal rate and regular rhythm.     Heart sounds: Murmur heard.  Pulmonary:     Effort: Pulmonary effort is normal.     Breath sounds: Normal breath sounds.  Abdominal:     General: There is no distension.     Palpations: Abdomen is soft.     Tenderness: There is no abdominal tenderness.  Skin:    General: Skin is warm.  Neurological:     Mental Status: She is alert and oriented to person, place, and time.  Psychiatric:        Attention and Perception: Attention normal.        Mood and Affect: Mood normal.        Speech: Speech normal.        Behavior: Behavior is cooperative.    Most recent functional status assessment:    11/24/2021   10:46 AM  In your present state of health, do you have any  difficulty performing the following activities:  Hearing? 0  Vision? 0  Difficulty concentrating or making decisions? 0  Walking or climbing stairs? 0  Dressing or bathing? 0  Doing errands, shopping? 0   Most recent fall risk assessment:    11/24/2021   10:45 AM  Fall Risk   Falls in the past year? 0  Number falls in past yr: 0  Injury with Fall? 0  Risk for fall due to : No Fall Risks  Follow up Falls evaluation completed    Most recent depression screenings:    11/24/2021   10:45 AM 11/20/2020   10:00 AM  PHQ 2/9 Scores  PHQ - 2 Score 0 0  PHQ- 9 Score 1 1   Most recent cognitive screening:    09/12/2018    9:04 AM  6CIT Screen  What Year? 0 points  What month? 0 points  What time? 0  points  Count back from 20 0 points  Months in reverse 0 points  Repeat phrase 0 points  Total Score 0 points   Most recent Audit-C alcohol use screening    11/24/2021   10:44 AM  Alcohol Use Disorder Test (AUDIT)  1. How often do you have a drink containing alcohol? 0  2. How many drinks containing alcohol do you have on a typical day when you are drinking? 0  3. How often do you have six or more drinks on one occasion? 0  AUDIT-C Score 0   A score of 3 or more in women, and 4 or more in men indicates increased risk for alcohol abuse, EXCEPT if all of the points are from question 1   No results found for any visits on 11/24/21.  Assessment & Plan     Annual wellness visit done today including the all of the following: Reviewed patient's Family Medical History Reviewed and updated list of patient's medical providers Assessment of cognitive impairment was done Assessed patient's functional ability Established a written schedule for health screening [place] Completed and Reviewed  Exercise Activities and Dietary recommendations --balanced diet high in fiber and protein, low in sugars, carbs, fats. --physical activity/exercise 30 minutes 3-5 times a week     Immunization History  Administered Date(s) Administered  . COVID-19, mRNA, vaccine(Comirnaty)12 years and older 10/26/2021  . Fluad Quad(high Dose 65+) 10/23/2018, 10/26/2021  . Hepatitis B 10/11/1991, 11/13/1991, 04/09/1992  . Influenza Split 11/02/2006  . Influenza, High Dose Seasonal PF 10/30/2015, 10/19/2017, 10/09/2019, 10/15/2020  . Influenza-Unspecified 11/14/2016  . PFIZER Comirnaty(Gray Top)Covid-19 Tri-Sucrose Vaccine 05/21/2020  . PFIZER(Purple Top)SARS-COV-2 Vaccination 01/31/2019, 02/21/2019, 11/07/2019  . Pension scheme manager 81yr & up 11/04/2020  . Pneumococcal Conjugate-13 03/20/2014  . Pneumococcal Polysaccharide-23 11/19/2003, 12/27/2010  . Respiratory  Syncytial Virus Vaccine,Recomb Aduvanted(Arexvy) 10/11/2021  . Td 12/09/2008  . Tdap 02/17/2020  . Zoster Recombinat (Shingrix) 04/13/2020, 09/17/2020  . Zoster, Live 02/08/2006    Health Maintenance  Topic Date Due  . COVID-19 Vaccine (6 - Pfizer series) 03/07/2021  . DEXA SCAN  04/29/2022  . Medicare Annual Wellness (AWV)  11/25/2022  . COLONOSCOPY (Pts 45-463yrInsurance coverage will need to be confirmed)  12/01/2022  . TETANUS/TDAP  02/16/2030  . Pneumonia Vaccine 6580Years old  Completed  . INFLUENZA VACCINE  Completed  . Zoster Vaccines- Shingrix  Completed  . HPV VACCINES  Aged Out     Discussed health benefits of physical activity, and encouraged her to engage in regular exercise appropriate for her  age and condition.    Problem List Items Addressed This Visit       Cardiovascular and Mediastinum   Mitral valve disorder    Audible murmur, pt reports distant history of echo. Denies sob, chest pain, fatigue, syncope      Primary hypertension    Elevated in office but normal range at home Continue medications Ordered cmp F/u 6 mo      Relevant Orders   CBC w/Diff/Platelet   Comprehensive Metabolic Panel (CMET)     Endocrine   Hypothyroidism, postop    Stable on medication Repeat tsh/t4      Relevant Orders   CBC w/Diff/Platelet   TSH + free T4     Other   Numbness and tingling    Will check b12 but may be lumbar etiology      Relevant Orders   Vitamin B12   Hyperglycemia    Historically, will check a1c      Relevant Orders   CBC w/Diff/Platelet   HgB A1c   Other Visit Diagnoses     Encounter for annual wellness exam in Medicare patient    -  Primary   Hyperlipidemia, unspecified hyperlipidemia type       Relevant Orders   Lipid Profile        Return in about 6 months (around 05/25/2022) for hypertension.     I, Suzanne Kirschner, PA-C have reviewed all documentation for this visit. The documentation on  11/24/2021  for the exam,  diagnosis, procedures, and orders are all accurate and complete.  Suzanne Kirschner, PA-C [facility] [address] [place], [place], [address] Office: [phone] Fax: [place]

## 2021-11-24 NOTE — Assessment & Plan Note (Signed)
Historically, will check a1c 

## 2021-11-24 NOTE — Assessment & Plan Note (Signed)
Stable on medication Repeat tsh/t4

## 2021-11-25 DIAGNOSIS — I10 Essential (primary) hypertension: Secondary | ICD-10-CM | POA: Diagnosis not present

## 2021-11-25 DIAGNOSIS — R739 Hyperglycemia, unspecified: Secondary | ICD-10-CM | POA: Diagnosis not present

## 2021-11-25 DIAGNOSIS — E785 Hyperlipidemia, unspecified: Secondary | ICD-10-CM | POA: Diagnosis not present

## 2021-11-25 DIAGNOSIS — E890 Postprocedural hypothyroidism: Secondary | ICD-10-CM | POA: Diagnosis not present

## 2021-11-26 LAB — COMPREHENSIVE METABOLIC PANEL
ALT: 26 IU/L (ref 0–32)
AST: 34 IU/L (ref 0–40)
Albumin/Globulin Ratio: 2 (ref 1.2–2.2)
Albumin: 4.8 g/dL — ABNORMAL HIGH (ref 3.7–4.7)
Alkaline Phosphatase: 88 IU/L (ref 44–121)
BUN/Creatinine Ratio: 17 (ref 12–28)
BUN: 15 mg/dL (ref 8–27)
Bilirubin Total: 0.7 mg/dL (ref 0.0–1.2)
CO2: 25 mmol/L (ref 20–29)
Calcium: 10.1 mg/dL (ref 8.7–10.3)
Chloride: 99 mmol/L (ref 96–106)
Creatinine, Ser: 0.88 mg/dL (ref 0.57–1.00)
Globulin, Total: 2.4 g/dL (ref 1.5–4.5)
Glucose: 97 mg/dL (ref 70–99)
Potassium: 5.2 mmol/L (ref 3.5–5.2)
Sodium: 139 mmol/L (ref 134–144)
Total Protein: 7.2 g/dL (ref 6.0–8.5)
eGFR: 66 mL/min/{1.73_m2} (ref >59)

## 2021-11-26 LAB — LIPID PANEL
Chol/HDL Ratio: 1.7 ratio (ref 0.0–4.4)
Cholesterol, Total: 236 mg/dL — ABNORMAL HIGH (ref 100–199)
HDL: 139 mg/dL (ref >39)
LDL Chol Calc (NIH): 88 mg/dL (ref 0–99)
Triglycerides: 55 mg/dL (ref 0–149)
VLDL Cholesterol Cal: 9 mg/dL (ref 5–40)

## 2021-11-26 LAB — TSH+FREE T4
Free T4: 1.76 ng/dL (ref 0.82–1.77)
TSH: 2.44 u[IU]/mL (ref 0.450–4.500)

## 2021-11-26 LAB — CBC WITH DIFFERENTIAL/PLATELET
Basophils Absolute: 0.1 10*3/uL (ref 0.0–0.2)
Basos: 2 %
EOS (ABSOLUTE): 0.2 10*3/uL (ref 0.0–0.4)
Eos: 3 %
Hematocrit: 41.8 % (ref 34.0–46.6)
Hemoglobin: 14.2 g/dL (ref 11.1–15.9)
Immature Grans (Abs): 0 10*3/uL (ref 0.0–0.1)
Immature Granulocytes: 0 %
Lymphocytes Absolute: 1.7 10*3/uL (ref 0.7–3.1)
Lymphs: 27 %
MCH: 30.3 pg (ref 26.6–33.0)
MCHC: 34 g/dL (ref 31.5–35.7)
MCV: 89 fL (ref 79–97)
Monocytes Absolute: 0.8 10*3/uL (ref 0.1–0.9)
Monocytes: 12 %
Neutrophils Absolute: 3.4 10*3/uL (ref 1.4–7.0)
Neutrophils: 56 %
Platelets: 137 10*3/uL — ABNORMAL LOW (ref 150–450)
RBC: 4.69 x10E6/uL (ref 3.77–5.28)
RDW: 11.5 % — ABNORMAL LOW (ref 11.7–15.4)
WBC: 6.1 10*3/uL (ref 3.4–10.8)

## 2021-11-26 LAB — HEMOGLOBIN A1C
Est. average glucose Bld gHb Est-mCnc: 108 mg/dL
Hgb A1c MFr Bld: 5.4 % (ref 4.8–5.6)

## 2021-11-26 LAB — VITAMIN B12: Vitamin B-12: 1804 pg/mL — ABNORMAL HIGH (ref 232–1245)

## 2021-12-06 ENCOUNTER — Telehealth: Payer: Self-pay

## 2021-12-06 NOTE — Telephone Encounter (Signed)
Copied from [place] [id number]. 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 Please assist patient further

## 2021-12-28 DIAGNOSIS — D485 Neoplasm of uncertain behavior of skin: Secondary | ICD-10-CM | POA: Diagnosis not present

## 2021-12-28 DIAGNOSIS — L570 Actinic keratosis: Secondary | ICD-10-CM | POA: Diagnosis not present

## 2021-12-28 DIAGNOSIS — L0889 Other specified local infections of the skin and subcutaneous tissue: Secondary | ICD-10-CM | POA: Diagnosis not present

## 2021-12-28 DIAGNOSIS — L821 Other seborrheic keratosis: Secondary | ICD-10-CM | POA: Diagnosis not present

## 2022-01-22 ENCOUNTER — Other Ambulatory Visit: Payer: Self-pay | Admitting: Family Medicine

## 2022-01-23 NOTE — Telephone Encounter (Signed)
Requested Prescriptions  Pending Prescriptions Disp Refills   levothyroxine (SYNTHROID) 100 MCG tablet [Pharmacy Med Name: LEVOTHYROXINE 100 MCG TABLET] 90 tablet 3    Sig: TAKE 1 TABLET BY MOUTH EVERY DAY     Endocrinology:  Hypothyroid Agents Passed - 01/22/2022  8:59 AM      Passed - TSH in normal range and within 360 days    TSH  Date Value Ref Range Status  11/25/2021 2.440 0.450 - 4.500 uIU/mL Final         Passed - Valid encounter within last 12 months    Recent Outpatient Visits           2 months ago Encounter for annual wellness exam in Medicare patient   [facility] Thedore Mins, [place], PA-C   1 year ago Annual physical exam   [facility] Birdie Sons, MD   1 year ago Fall, initial encounter   [employer], Vickki Muff, PA-C   2 years ago Annual physical exam   [facility] Birdie Sons, MD   2 years ago Hypothyroidism, postop   [facility] Trinna Post, [place]       Future Appointments             In 3 months Fisher, Kirstie Peri, MD [facility], [place]

## 2022-03-10 DIAGNOSIS — Z859 Personal history of malignant neoplasm, unspecified: Secondary | ICD-10-CM | POA: Diagnosis not present

## 2022-03-10 DIAGNOSIS — Z872 Personal history of diseases of the skin and subcutaneous tissue: Secondary | ICD-10-CM | POA: Diagnosis not present

## 2022-03-10 DIAGNOSIS — L578 Other skin changes due to chronic exposure to nonionizing radiation: Secondary | ICD-10-CM | POA: Diagnosis not present

## 2022-03-10 DIAGNOSIS — L300 Nummular dermatitis: Secondary | ICD-10-CM | POA: Diagnosis not present

## 2022-03-10 DIAGNOSIS — L570 Actinic keratosis: Secondary | ICD-10-CM | POA: Diagnosis not present

## 2022-03-16 ENCOUNTER — Telehealth: Payer: Self-pay

## 2022-03-16 DIAGNOSIS — E2839 Other primary ovarian failure: Secondary | ICD-10-CM

## 2022-03-16 DIAGNOSIS — M8580 Other specified disorders of bone density and structure, unspecified site: Secondary | ICD-10-CM

## 2022-03-16 NOTE — Telephone Encounter (Signed)
Copied from [place] [phone]. Topic: Appointment Scheduling - Scheduling Inquiry for Clinic >> Mar 16, 2022  9:22 AM Erskine Squibb wrote: Reason for CRM: The patient called the [facility] because she is due for a mammogram and she can get that but she also normally gets a dexa scan but needs an order for that to be sent to them. She says it has been a few years since she last had a dexa scan and is due in April. Please assist patient further.

## 2022-03-16 NOTE — Addendum Note (Signed)
Addended by: Birdie Sons on: 03/16/2022 10:11 AM   Modules accepted: Orders

## 2022-03-24 ENCOUNTER — Other Ambulatory Visit: Payer: Self-pay | Admitting: Family Medicine

## 2022-03-24 DIAGNOSIS — Z1231 Encounter for screening mammogram for malignant neoplasm of breast: Secondary | ICD-10-CM

## 2022-05-16 ENCOUNTER — Encounter: Payer: Self-pay | Admitting: Family Medicine

## 2022-05-16 ENCOUNTER — Ambulatory Visit (INDEPENDENT_AMBULATORY_CARE_PROVIDER_SITE_OTHER): Payer: Medicare HMO | Admitting: Family Medicine

## 2022-05-16 VITALS — BP 130/53 | HR 96 | Ht 60.0 in | Wt 104.0 lb

## 2022-05-16 DIAGNOSIS — M8580 Other specified disorders of bone density and structure, unspecified site: Secondary | ICD-10-CM | POA: Diagnosis not present

## 2022-05-16 DIAGNOSIS — I10 Essential (primary) hypertension: Secondary | ICD-10-CM | POA: Diagnosis not present

## 2022-05-16 NOTE — Progress Notes (Signed)
      Established patient visit   Patient: Suzanne Morgan   DOB: 1940/01/31   82 y.o. Female  MRN: [id number] Visit Date: 05/16/2022  Today's healthcare provider: Mila Merry, MD   No chief complaint on file.  Subjective    HPI Here to follow up on hypertension, doing well on amlodipine 2.5 with no adverse effects. Home BP in the 110-120s/40s-50s.   Also for follow up of osteopenia.  Lab Results  Component Value Date   VD25OH 42.3 08/29/2018   Is scheduled for BMD tomorrow.   Medications: Outpatient Medications Prior to Visit  Medication Sig   amLODipine (NORVASC) 2.5 MG tablet TAKE 1 TABLET BY MOUTH EVERY DAY   calcium carbonate (OS-CAL) 600 MG TABS tablet Take 600 mg by mouth daily with breakfast.    levothyroxine (SYNTHROID) 100 MCG tablet TAKE 1 TABLET BY MOUTH EVERY DAY   Multiple Vitamin tablet Take 1 tablet by mouth daily.    [DISCONTINUED] AREXVY 120 MCG/0.5ML injection    No facility-administered medications prior to visit.    Review of Systems  Constitutional:  Negative for appetite change, chills, fatigue and fever.  Respiratory:  Negative for chest tightness and shortness of breath.   Cardiovascular:  Negative for chest pain and palpitations.  Gastrointestinal:  Negative for abdominal pain, nausea and vomiting.  Neurological:  Negative for dizziness and weakness.       Objective    BP (!) 130/53   Pulse 96   Ht 5' (1.524 m)   Wt 104 lb (47.2 kg)   SpO2 100%   BMI 20.31 kg/m      Assessment & Plan     1. Primary hypertension Well controlled.  Continue current medications.    2. Osteopenia, unspecified location Normal vitamin D levels, BMD scheduled for tomorrow.  Future Appointments  Date Time Provider Department Center  05/17/2022  9:40 AM [facility]  05/17/2022 10:20 AM [facility]1 [facility]  11/29/2022  9:00 AM Tasha Jindra, Demetrios Isaacs, MD [facility]           Mila Merry, MD  [facility] [phone] (phone) [phone] (fax)  [facility]

## 2022-05-17 ENCOUNTER — Ambulatory Visit
Admission: RE | Admit: 2022-05-17 | Discharge: 2022-05-17 | Disposition: A | Discharge status: Home / Self Care | Payer: Medicare HMO | Source: Ambulatory Visit | Attending: Family Medicine | Admitting: Family Medicine

## 2022-05-17 DIAGNOSIS — M8580 Other specified disorders of bone density and structure, unspecified site: Secondary | ICD-10-CM | POA: Insufficient documentation

## 2022-05-17 DIAGNOSIS — E2839 Other primary ovarian failure: Secondary | ICD-10-CM | POA: Diagnosis not present

## 2022-05-17 DIAGNOSIS — M8589 Other specified disorders of bone density and structure, multiple sites: Secondary | ICD-10-CM | POA: Diagnosis not present

## 2022-05-17 DIAGNOSIS — Z780 Asymptomatic menopausal state: Secondary | ICD-10-CM | POA: Diagnosis not present

## 2022-05-17 DIAGNOSIS — Z1231 Encounter for screening mammogram for malignant neoplasm of breast: Secondary | ICD-10-CM | POA: Diagnosis not present

## 2022-06-23 DIAGNOSIS — C44722 Squamous cell carcinoma of skin of right lower limb, including hip: Secondary | ICD-10-CM | POA: Diagnosis not present

## 2022-06-23 DIAGNOSIS — D485 Neoplasm of uncertain behavior of skin: Secondary | ICD-10-CM | POA: Diagnosis not present

## 2022-07-21 ENCOUNTER — Other Ambulatory Visit: Payer: Self-pay | Admitting: Family Medicine

## 2022-07-21 DIAGNOSIS — I10 Essential (primary) hypertension: Secondary | ICD-10-CM

## 2022-07-21 NOTE — Telephone Encounter (Signed)
05/16/22- per OV note continue current medication  Requested Prescriptions  Pending Prescriptions Disp Refills   amLODipine (NORVASC) 2.5 MG tablet [Pharmacy Med Name: AMLODIPINE BESYLATE 2.5 MG TAB] 90 tablet 4    Sig: TAKE 1 TABLET BY MOUTH EVERY DAY     Cardiovascular: Calcium Channel Blockers 2 Passed - 07/21/2022  2:31 AM      Passed - Last BP in normal range    BP Readings from Last 1 Encounters:  05/16/22 (!) 130/53         Passed - Last Heart Rate in normal range    Pulse Readings from Last 1 Encounters:  05/16/22 96         Passed - Valid encounter within last 6 months    Recent Outpatient Visits           2 months ago Primary hypertension   [place] Malva Limes, MD   7 months ago Encounter for annual wellness exam in Medicare patient   [facility] Alfredia Ferguson, PA-C   1 year ago Annual physical exam   [facility] Malva Limes, MD   2 years ago Fall, initial encounter   [facility] Chrismon, Jodell Cipro, PA-C   2 years ago Annual physical exam   [facility] Sherrie Mustache, Demetrios Isaacs, MD

## 2022-08-02 DIAGNOSIS — C44722 Squamous cell carcinoma of skin of right lower limb, including hip: Secondary | ICD-10-CM | POA: Diagnosis not present

## 2022-08-09 DIAGNOSIS — L0390 Cellulitis, unspecified: Secondary | ICD-10-CM | POA: Diagnosis not present

## 2022-09-22 DIAGNOSIS — Z859 Personal history of malignant neoplasm, unspecified: Secondary | ICD-10-CM | POA: Diagnosis not present

## 2022-09-22 DIAGNOSIS — L720 Epidermal cyst: Secondary | ICD-10-CM | POA: Diagnosis not present

## 2022-10-17 ENCOUNTER — Other Ambulatory Visit: Payer: Self-pay | Admitting: Family Medicine

## 2022-10-18 NOTE — Telephone Encounter (Signed)
Rx 01/23/22 #90 3RF- too soon Requested Prescriptions  Pending Prescriptions Disp Refills   levothyroxine (SYNTHROID) 100 MCG tablet [Pharmacy Med Name: LEVOTHYROXINE 100 MCG TABLET] 90 tablet 3    Sig: TAKE 1 TABLET BY MOUTH EVERY DAY     Endocrinology:  Hypothyroid Agents Passed - 10/17/2022 10:52 AM      Passed - TSH in normal range and within 360 days    TSH  Date Value Ref Range Status  11/25/2021 2.440 0.450 - 4.500 uIU/mL Final         Passed - Valid encounter within last 12 months    Recent Outpatient Visits           5 months ago Primary hypertension   [facility] Malva Limes, MD   10 months ago Encounter for annual wellness exam in Medicare patient   [facility] Alfredia Ferguson, PA-C   1 year ago Annual physical exam   [facility] Malva Limes, MD   2 years ago Fall, initial encounter   [facility] Chrismon, Jodell Cipro, PA-C   2 years ago Annual physical exam   [facility] Sherrie Mustache, Demetrios Isaacs, MD

## 2022-10-24 DIAGNOSIS — Z961 Presence of intraocular lens: Secondary | ICD-10-CM | POA: Diagnosis not present

## 2022-10-24 DIAGNOSIS — H02831 Dermatochalasis of right upper eyelid: Secondary | ICD-10-CM | POA: Diagnosis not present

## 2022-10-24 DIAGNOSIS — G43109 Migraine with aura, not intractable, without status migrainosus: Secondary | ICD-10-CM | POA: Diagnosis not present

## 2022-11-29 ENCOUNTER — Encounter: Payer: Medicare HMO | Admitting: Family Medicine

## 2022-12-02 ENCOUNTER — Encounter: Payer: Self-pay | Admitting: Family Medicine

## 2022-12-02 ENCOUNTER — Ambulatory Visit (INDEPENDENT_AMBULATORY_CARE_PROVIDER_SITE_OTHER): Payer: Medicare HMO | Admitting: Family Medicine

## 2022-12-02 VITALS — BP 152/65 | HR 96 | Ht 60.0 in | Wt 106.5 lb

## 2022-12-02 DIAGNOSIS — Z0000 Encounter for general adult medical examination without abnormal findings: Secondary | ICD-10-CM | POA: Diagnosis not present

## 2022-12-02 DIAGNOSIS — E890 Postprocedural hypothyroidism: Secondary | ICD-10-CM

## 2022-12-02 DIAGNOSIS — R011 Cardiac murmur, unspecified: Secondary | ICD-10-CM | POA: Insufficient documentation

## 2022-12-02 DIAGNOSIS — I10 Essential (primary) hypertension: Secondary | ICD-10-CM

## 2022-12-02 NOTE — Progress Notes (Unsigned)
Annual Wellness Visit     Patient: Suzanne Morgan, Female    DOB: 10/21/1940, 82 y.o.   MRN: [id number] Visit Date: 12/02/2022  Today's Provider: Mila Merry, MD   Chief Complaint  Patient presents with   Medicare Wellness   Subjective    Suzanne Morgan is a 82 y.o. female who presents today for her Annual Wellness Visit.    Medications: Outpatient Medications Prior to Visit  Medication Sig   amLODipine (NORVASC) 2.5 MG tablet TAKE 1 TABLET BY MOUTH EVERY DAY   calcium carbonate (OS-CAL) 600 MG TABS tablet Take 600 mg by mouth daily with breakfast.    levothyroxine (SYNTHROID) 100 MCG tablet TAKE 1 TABLET BY MOUTH EVERY DAY   Multiple Vitamin tablet Take 1 tablet by mouth daily.    No facility-administered medications prior to visit.    Allergies  Allergen Reactions   Clindamycin/Lincomycin Diarrhea   Sulfa Antibiotics    Other Rash    KAPIDEX     Patient Care Team: Malva Limes, MD as PCP - General (Family Medicine) [place], [facility] as Consulting Physician (Optometry) Jesusita Oka, MD (Dermatology) Pasty Spillers, MD (Inactive) as Consulting Physician (Gastroenterology)   Objective     Most recent functional status assessment:    05/16/2022    9:38 AM  In your present state of health, do you have any difficulty performing the following activities:  Hearing? 0  Vision? 0  Difficulty concentrating or making decisions? 0  Walking or climbing stairs? 0  Dressing or bathing? 0  Doing errands, shopping? 0   Most recent fall risk assessment:    05/16/2022    9:38 AM  Fall Risk   Falls in the past year? 0  Number falls in past yr: 0  Injury with Fall? 0    Most recent depression screenings:    12/02/2022    9:48 AM 05/16/2022    9:38 AM  PHQ 2/9 Scores  PHQ - 2 Score 0 0  PHQ- 9 Score 1 1   Most recent cognitive screening:    09/12/2018    9:04 AM  6CIT Screen  What Year? 0 points  What month? 0 points  What time? 0  points  Count back from 20 0 points  Months in reverse 0 points  Repeat phrase 0 points  Total Score 0 points   Most recent Audit-C alcohol use screening    05/16/2022    9:38 AM  Alcohol Use Disorder Test (AUDIT)  1. How often do you have a drink containing alcohol? 0  3. How often do you have six or more drinks on one occasion? 0   A score of 3 or more in women, and 4 or more in men indicates increased risk for alcohol abuse, EXCEPT if all of the points are from question 1     Assessment & Plan     Annual wellness visit done today including the all of the following: Reviewed patient's Family Medical History Reviewed and updated list of patient's medical providers Assessment of cognitive impairment was done Assessed patient's functional ability Established a written schedule for health screening services Health Risk Assessent Completed and Reviewed  Exercise Activities and Dietary recommendations  Goals      DIET - INCREASE WATER INTAKE     Recommend increasing water intake to 4 glasses a day.         Immunization History  Administered Date(s) Administered   Fluad Quad(high  Dose 65+) 10/23/2018, 10/26/2021   Hepatitis B 10/11/1991, 11/13/1991, 04/09/1992   Influenza Split 11/02/2006   Influenza, High Dose Seasonal PF 10/30/2015, 10/19/2017, 10/09/2019, 10/15/2020, 10/17/2022   Influenza-Unspecified 11/14/2016   PFIZER Comirnaty(Gray Top)Covid-19 Tri-Sucrose Vaccine 05/21/2020   PFIZER(Purple Top)SARS-COV-2 Vaccination 01/31/2019, 02/21/2019, 11/07/2019   Pfizer Covid-19 Vaccine Bivalent Booster 53yrs & up 11/04/2020   Pfizer(Comirnaty)Fall Seasonal Vaccine 12 years and older 10/26/2021, 10/17/2022   Pneumococcal Conjugate-13 03/20/2014   Pneumococcal Polysaccharide-23 11/19/2003, 12/27/2010   Respiratory Syncytial Virus Vaccine,Recomb Aduvanted(Arexvy) 10/11/2021   Td 12/09/2008   Tdap 02/17/2020   Zoster Recombinant(Shingrix) 04/13/2020, 09/17/2020   Zoster,  Live 02/08/2006    Health Maintenance  Topic Date Due   Medicare Annual Wellness (AWV)  12/02/2023   DEXA SCAN  05/16/2024   DTaP/Tdap/Td (3 - Td or Tdap) 02/16/2030   Pneumonia Vaccine 40+ Years old  Completed   INFLUENZA VACCINE  Completed   COVID-19 Vaccine  Completed   Zoster Vaccines- Shingrix  Completed   HPV VACCINES  Aged Out   Colonoscopy  Discontinued     Discussed health benefits of physical activity, and encouraged her to engage in regular exercise appropriate for her age and condition.           Mila Merry, MD  [facility] [phone] (phone) [phone] (fax)  [facility]

## 2022-12-02 NOTE — Progress Notes (Unsigned)
Complete physical exam   Patient: Suzanne Morgan   DOB: 10/04/1940   82 y.o. Female  MRN: [id number] Visit Date: 12/02/2022  Today's healthcare provider: Mila Merry, MD    Subjective    Discussed the use of AI scribe software for clinical note transcription with the patient, who gave verbal consent to proceed.  History of Present Illness   The patient, with a history of mitral valve prolapse, presents for a yearly physical. She expresses concern about her blood pressure readings, which have been higher than usual. The patient has been monitoring her blood pressure at home, but recent readings have been consistently around 150, which is unusual for her. She denies any associated symptoms such as chest pain or heart problems.  The patient also reports a discomfort in the chest area, which she initially thought was heartburn. The discomfort, described as similar to an unexpressed hiccup, is intermittent and has been present for a couple of months. It is not associated with exertion or physical activity and tends to resolve quickly. The patient has been managing this discomfort by adjusting her diet and drinking habits which is effective.   Additionally, the patient reports nocturnal leg pain that tends to occur later in the night. The pain is not present when she first lays down but starts in the middle of the night. She is wondering if this is related to amlodipine.   Lastly, the patient is on levothyroxine for hypothyroid. She takes it consistently in the morning, along with calcium and vitamin supplements. She has been considering switching the timing of her amlodipine to the morning to see if it alleviates the nocturnal symptoms.         Past Medical History:  Diagnosis Date   Herpes zoster without complication 12/12/2008   History of chicken pox    History of mumps    Hypertension    Hypothyroidism    Mitral valve prolapse    Squamous cell carcinoma    right lower leg.  excised by Dr. Ebony Cargo   Past Surgical History:  Procedure Laterality Date   BREAST CYST ASPIRATION Right many yrs ago   negative   CATARACT EXTRACTION W/PHACO Left 03/03/2020   Procedure: CATARACT EXTRACTION PHACO AND INTRAOCULAR LENS PLACEMENT (IOC) LEFT 4.84 00:40.3 ;  Surgeon: Galen Manila, MD;  Location: [facility];  Service: Ophthalmology;  Laterality: Left;   CATARACT EXTRACTION W/PHACO Right 03/17/2020   Procedure: CATARACT EXTRACTION PHACO AND INTRAOCULAR LENS PLACEMENT (IOC) RIGHT 11.27 01:01.9;  Surgeon: Galen Manila, MD;  Location: [facility];  Service: Ophthalmology;  Laterality: Right;   COLONOSCOPY WITH PROPOFOL N/A 11/30/2017   Procedure: COLONOSCOPY WITH PROPOFOL;  Surgeon: Pasty Spillers, MD;  Location: [facility];  Service: Endoscopy;  Laterality: N/A;   SQUAMOUS CELL CARCINOMA EXCISION Right    Right lateral lower leg Dr. Ebony Cargo   THYROIDECTOMY     UPPER GI ENDOSCOPY  06/13/07   HIATAL HERNIA, IRREGEULAR Z-LINE   Social History   Socioeconomic History   Marital status: Married    Spouse name: Not on file   Number of children: 1   Years of education: 2 adopted children   Highest education level: Bachelor's degree (e.g., BA, AB, BS)  Occupational History   Not on file  Tobacco Use   Smoking status: Never   Smokeless tobacco: Never  Vaping Use   Vaping status: Never Used  Substance and Sexual Activity   Alcohol use: Yes    Alcohol/week:  1.0 - 2.0 standard drink of alcohol    Types: 1 - 2 Glasses of wine per week   Drug use: No   Sexual activity: Not on file  Other Topics Concern   Not on file  Social History Narrative   Pt as 1 biological child and 2 adopted.   Social Determinants of Health   Financial Resource Strain: Low Risk  (04/10/2017)   Overall Financial Resource Strain (CARDIA)    Difficulty of Paying Living Expenses: Not hard at all  Food Insecurity: No Food Insecurity (04/10/2017)   Hunger Vital  Sign    Worried About Running Out of Food in the Last Year: Never true    Ran Out of Food in the Last Year: Never true  Transportation Needs: No Transportation Needs (04/10/2017)   PRAPARE - Administrator, Civil Service (Medical): No    Lack of Transportation (Non-Medical): No  Physical Activity: Inactive (09/12/2018)   Exercise Vital Sign    Days of Exercise per Week: 0 days    Minutes of Exercise per Session: 0 min  Stress: No Stress Concern Present (09/12/2018)   [employer] of Occupational Health - Occupational Stress Questionnaire    Feeling of Stress : Not at all  Social Connections: Unknown (09/12/2018)   Social Connection and Isolation Panel [NHANES]    Frequency of Communication with Friends and Family: Patient declined    Frequency of Social Gatherings with Friends and Family: Patient declined    Attends Religious Services: Patient declined    Database administrator or Organizations: Patient declined    Attends Banker Meetings: Patient declined    Marital Status: Patient declined  Intimate Partner Violence: Unknown (09/12/2018)   Humiliation, Afraid, Rape, and Kick questionnaire    Fear of Current or Ex-Partner: Patient declined    Emotionally Abused: Patient declined    Physically Abused: Patient declined    Sexually Abused: Patient declined   Family Status  Relation Name Status   Mother  Deceased at age 2       CVA   Father  Deceased at age 33       MI   Brother  Deceased at age 31  No partnership data on file   Family History  Problem Relation Age of Onset   Stroke Mother    Heart attack Father    Allergies  Allergen Reactions   Clindamycin/Lincomycin Diarrhea   Sulfa Antibiotics    Other Rash    KAPIDEX     Patient Care Team: Malva Limes, MD as PCP - General (Family Medicine) [place], [facility] as Consulting Physician (Optometry) [place], Darien Ramus, MD (Dermatology) Pasty Spillers, MD (Inactive) as  Consulting Physician (Gastroenterology)   Medications: Outpatient Medications Prior to Visit  Medication Sig   amLODipine (NORVASC) 2.5 MG tablet TAKE 1 TABLET BY MOUTH EVERY DAY   calcium carbonate (OS-CAL) 600 MG TABS tablet Take 600 mg by mouth daily with breakfast.    levothyroxine (SYNTHROID) 100 MCG tablet TAKE 1 TABLET BY MOUTH EVERY DAY   Multiple Vitamin tablet Take 1 tablet by mouth daily.    No facility-administered medications prior to visit.    Review of Systems  Constitutional:  Negative for chills, diaphoresis and fever.  HENT:  Negative for congestion, ear discharge, ear pain, hearing loss, nosebleeds, sore throat and tinnitus.   Eyes:  Negative for photophobia, pain, discharge and redness.  Respiratory:  Negative for cough, shortness of breath, wheezing and  stridor.   Cardiovascular:  Negative for chest pain, palpitations and leg swelling.  Gastrointestinal:  Negative for abdominal pain, blood in stool, constipation, diarrhea, nausea and vomiting.  Endocrine: Negative for polydipsia.  Genitourinary:  Negative for dysuria, flank pain, frequency, hematuria and urgency.  Musculoskeletal:  Negative for back pain, myalgias and neck pain.  Skin:  Negative for rash.  Allergic/Immunologic: Negative for environmental allergies.  Neurological:  Negative for dizziness, tremors, seizures, weakness and headaches.  Hematological:  Does not bruise/bleed easily.  Psychiatric/Behavioral:  Negative for hallucinations and suicidal ideas. The patient is not nervous/anxious.    {Insert previous labs (optional):23779} {See past labs  Heme  Chem  Endocrine  Serology  Results Review (optional):1}  Objective    BP (!) 152/65 (BP Location: Left Arm, Patient Position: Sitting, Cuff Size: Normal)   Pulse 96   Ht 5' (1.524 m)   Wt 106 lb 8 oz (48.3 kg)   SpO2 100%   BMI 20.80 kg/m  {Insert last BP/Wt (optional):23777}{See vitals history (optional):1}  Physical Exam  General  Appearance:    Well developed, well nourished female. Alert, cooperative, in no acute distress, appears stated age   Head:    Normocephalic, without obvious abnormality, atraumatic  Eyes:    PERRL, conjunctiva/corneas clear, EOM's intact, fundi    benign, both eyes  Ears:    Normal TM's and external ear canals, both ears  Nose:   Nares normal, septum midline, mucosa normal, no drainage    or sinus tenderness  Throat:   Lips, mucosa, and tongue normal; teeth and gums normal  Neck:   Supple, symmetrical, trachea midline, no adenopathy;    thyroid:  no enlargement/tenderness/nodules; no carotid   bruit or JVD  Back:     Symmetric, no curvature, ROM normal, no CVA tenderness  Lungs:     Clear to auscultation bilaterally, respirations unlabored  Chest Wall:    No tenderness or deformity   Heart:    Normal heart rate. Normal rhythm.  3/6 diastolic murmur at second left intercostal space at left lower sternal border  Breast Exam:    deferred  Abdomen:     Soft, non-tender, bowel sounds active all four quadrants,    no masses, no organomegaly  Pelvic:    deferred  Extremities:   All extremities are intact. No cyanosis or edema  Pulses:   2+ and symmetric all extremities  Skin:   Skin color, texture, turgor normal, no rashes or lesions  Lymph nodes:   Cervical, supraclavicular, and axillary nodes normal  Neurologic:   CNII-XII intact, normal strength, sensation and reflexes    throughout       Assessment & Plan    Routine Health Maintenance and Physical Exam  Exercise Activities and Dietary recommendations  Goals      DIET - INCREASE WATER INTAKE     Recommend increasing water intake to 4 glasses a day.         Immunization History  Administered Date(s) Administered   Fluad Quad(high Dose 65+) 10/23/2018, 10/26/2021   Hepatitis B 10/11/1991, 11/13/1991, 04/09/1992   Influenza Split 11/02/2006   Influenza, High Dose Seasonal PF 10/30/2015, 10/19/2017, 10/09/2019, 10/15/2020,  10/17/2022   Influenza-Unspecified 11/14/2016   PFIZER Comirnaty(Gray Top)Covid-19 Tri-Sucrose Vaccine 05/21/2020   PFIZER(Purple Top)SARS-COV-2 Vaccination 01/31/2019, 02/21/2019, 11/07/2019   Pfizer Covid-19 Vaccine Bivalent Booster 17yrs & up 11/04/2020   Pfizer(Comirnaty)Fall Seasonal Vaccine 12 years and older 10/26/2021, 10/17/2022   Pneumococcal Conjugate-13 03/20/2014   Pneumococcal Polysaccharide-23 11/19/2003, 12/27/2010  Respiratory Syncytial Virus Vaccine,Recomb Aduvanted(Arexvy) 10/11/2021   Td 12/09/2008   Tdap 02/17/2020   Zoster Recombinant(Shingrix) 04/13/2020, 09/17/2020   Zoster, Live 02/08/2006    Health Maintenance  Topic Date Due   Medicare Annual Wellness (AWV)  12/02/2023   DEXA SCAN  05/16/2024   DTaP/Tdap/Td (3 - Td or Tdap) 02/16/2030   Pneumonia Vaccine 13+ Years old  Completed   INFLUENZA VACCINE  Completed   COVID-19 Vaccine  Completed   Zoster Vaccines- Shingrix  Completed   HPV VACCINES  Aged Out   Colonoscopy  Discontinued    Discussed health benefits of physical activity, and encouraged her to engage in regular exercise appropriate for her age and condition.     Hypertension Elevated blood pressure reading in the office on both automatic and manual BP monitors. Patient reports lower readings at home with an older blood pressure cuff. Discussed the possibility of white coat hypertension and the need for accurate home readings. -Advise patient to purchase a new home blood pressure cuff and monitor brachial blood pressure at home. -Consider adding Hydrochlorothiazide if home readings confirm hypertension.  Nocturnal Leg Discomfort Reports of leg discomfort at night, possibly related to fluid retention from Amlodipine. -Advise to switch Amlodipine dosing to morning to potentially alleviate symptoms.  Mitral Valve Prolapse Auscultation revealed a more pronounced diastolic heart murmur than previous visits. No symptoms of heart failure or  palpitations reported. -Order echocardiogram to assess the mitral valve and rule out mitral stenosis.     No follow-ups on file.        Mila Merry, MD  [facility] [phone] (phone) [phone] (fax)  [facility]

## 2022-12-03 LAB — COMPREHENSIVE METABOLIC PANEL
ALT: 20 [IU]/L (ref 0–32)
AST: 30 [IU]/L (ref 0–40)
Albumin: 4.8 g/dL — ABNORMAL HIGH (ref 3.7–4.7)
Alkaline Phosphatase: 83 [IU]/L (ref 44–121)
BUN/Creatinine Ratio: 17 (ref 12–28)
BUN: 17 mg/dL (ref 8–27)
Bilirubin Total: 0.8 mg/dL (ref 0.0–1.2)
CO2: 25 mmol/L (ref 20–29)
Calcium: 10.1 mg/dL (ref 8.7–10.3)
Chloride: 99 mmol/L (ref 96–106)
Creatinine, Ser: 1.01 mg/dL — ABNORMAL HIGH (ref 0.57–1.00)
Globulin, Total: 2.6 g/dL (ref 1.5–4.5)
Glucose: 102 mg/dL — ABNORMAL HIGH (ref 70–99)
Potassium: 4.3 mmol/L (ref 3.5–5.2)
Sodium: 140 mmol/L (ref 134–144)
Total Protein: 7.4 g/dL (ref 6.0–8.5)
eGFR: 55 mL/min/{1.73_m2} — ABNORMAL LOW (ref >59)

## 2022-12-03 LAB — T4, FREE: Free T4: 1.81 ng/dL — ABNORMAL HIGH (ref 0.82–1.77)

## 2022-12-03 LAB — LIPID PANEL
Chol/HDL Ratio: 1.8 ratio (ref 0.0–4.4)
Cholesterol, Total: 250 mg/dL — ABNORMAL HIGH (ref 100–199)
HDL: 139 mg/dL (ref >39)
LDL Chol Calc (NIH): 100 mg/dL — ABNORMAL HIGH (ref 0–99)
Triglycerides: 66 mg/dL (ref 0–149)
VLDL Cholesterol Cal: 11 mg/dL (ref 5–40)

## 2022-12-03 LAB — CBC
Hematocrit: 43.5 % (ref 34.0–46.6)
Hemoglobin: 14.5 g/dL (ref 11.1–15.9)
MCH: 30.1 pg (ref 26.6–33.0)
MCHC: 33.3 g/dL (ref 31.5–35.7)
MCV: 90 fL (ref 79–97)
Platelets: 127 10*3/uL — ABNORMAL LOW (ref 150–450)
RBC: 4.81 x10E6/uL (ref 3.77–5.28)
RDW: 10.8 % — ABNORMAL LOW (ref 11.7–15.4)
WBC: 5.7 10*3/uL (ref 3.4–10.8)

## 2022-12-03 LAB — TSH: TSH: 1.76 u[IU]/mL (ref 0.450–4.500)

## 2022-12-04 ENCOUNTER — Encounter: Payer: Self-pay | Admitting: Family Medicine

## 2022-12-04 DIAGNOSIS — D696 Thrombocytopenia, unspecified: Secondary | ICD-10-CM | POA: Insufficient documentation

## 2022-12-14 ENCOUNTER — Telehealth: Payer: Self-pay | Admitting: Family Medicine

## 2022-12-14 NOTE — Telephone Encounter (Signed)
Patient called and states she has not heard anything back about scheduling her echocardiogram that Dr Sherrie Mustache put in an order for and wants to know how long it possibly will take to receive a call to get scheduled?  Please contact patient back @ phone # [phone]

## 2022-12-19 ENCOUNTER — Telehealth: Payer: Self-pay

## 2022-12-19 NOTE — Telephone Encounter (Signed)
Please review

## 2022-12-19 NOTE — Telephone Encounter (Signed)
Copied from CRM [id number]. Topic: Appointment Scheduling - Scheduling Inquiry for Clinic >> Dec 19, 2022  8:22 AM Suzanne Morgan wrote: Reason for CRM: Pt states she got a new bp cuff and wanting to have it checked along w/ the office bp cuff.  She would like to come in for a nurse visit. Anytime this week. Is that OK? Please call her.

## 2022-12-20 NOTE — Telephone Encounter (Signed)
Patient was called to let her know that we do not do nurse visit BP checks and patient hung up on me

## 2022-12-20 NOTE — Telephone Encounter (Signed)
Patient called back and states that she was supposed to be scheduled for an echo within 4 weeks of her last appointment and that they can not do it until 01/20/23.  She is concerned about her BP being up and wants to know if Dr Sherrie Mustache needs to see her sooner than planned.

## 2022-12-28 NOTE — Telephone Encounter (Signed)
Can this echo be done sooner? Order was place a month ago and nobody contacted her for 3 weeks after order was placed.

## 2022-12-30 ENCOUNTER — Ambulatory Visit: Payer: Self-pay

## 2022-12-30 DIAGNOSIS — I10 Essential (primary) hypertension: Secondary | ICD-10-CM

## 2022-12-30 NOTE — Telephone Encounter (Signed)
  Chief Complaint: BP readings Symptoms: none Frequency: now Pertinent Negatives: Patient denies any s/s Disposition: [] ED /[] Urgent Care (no appt availability in office) / [] Appointment(In office/virtual)/ []  [place] [facility]/ [] Home Care/ [] Refused Recommended Disposition /[] [place] [facility]/ [x]  Follow-up with PCP Additional Notes: Pt has been keeping a log of  blood pressure readings. The pt will drop off a copy of this for Dr. Sherrie Mustache to review to determine if any adjustments need to be made to HTN medication.    Summary: blood pressure readings   Please call patient would like to discuss her medical bp readings. (Doctors first available was Dec 27)     Reason for Disposition  [1] Follow-up call from patient regarding patient's clinical status AND [2] information NON-URGENT  Answer Assessment - Initial Assessment Questions 1. REASON FOR CALL or QUESTION: "What is your reason for calling today?" or "How can I best help you?" or "What question do you have that I can help answer?"     BP reading log 2. CALLER: Document the source of call. (e.g., laboratory, patient).     Pt.  Protocols used: PCP Call - No Triage-A-AH

## 2023-01-03 NOTE — Telephone Encounter (Signed)
Home readings all look good. Continue current blood pressure medications. Please schedule follow up o.v. 6 months.

## 2023-01-04 MED ORDER — LEVOTHYROXINE SODIUM 100 MCG PO TABS: 100.0000 ug | ORAL_TABLET | Freq: Every day | ORAL | 1 refills | Status: DC

## 2023-01-04 MED ORDER — AMLODIPINE BESYLATE 2.5 MG PO TABS: 2.5000 mg | ORAL_TABLET | Freq: Every day | ORAL | 1 refills | Status: DC

## 2023-01-04 NOTE — Telephone Encounter (Signed)
Pt advised. Appt scheduled

## 2023-01-04 NOTE — Addendum Note (Signed)
Addended by: Lily Kocher on: 01/04/2023 08:28 AM   Modules accepted: Orders

## 2023-01-16 ENCOUNTER — Ambulatory Visit: Payer: Medicare HMO

## 2023-01-20 ENCOUNTER — Ambulatory Visit
Admission: RE | Admit: 2023-01-20 | Discharge: 2023-01-20 | Disposition: A | Discharge status: Home / Self Care | Payer: Medicare HMO | Source: Ambulatory Visit | Attending: Family Medicine | Admitting: Family Medicine

## 2023-01-20 ENCOUNTER — Other Ambulatory Visit: Payer: Self-pay | Admitting: Family Medicine

## 2023-01-20 DIAGNOSIS — R011 Cardiac murmur, unspecified: Secondary | ICD-10-CM | POA: Insufficient documentation

## 2023-01-20 DIAGNOSIS — E890 Postprocedural hypothyroidism: Secondary | ICD-10-CM | POA: Insufficient documentation

## 2023-01-20 DIAGNOSIS — I10 Essential (primary) hypertension: Secondary | ICD-10-CM | POA: Diagnosis not present

## 2023-01-20 DIAGNOSIS — I340 Nonrheumatic mitral (valve) insufficiency: Secondary | ICD-10-CM | POA: Insufficient documentation

## 2023-01-20 DIAGNOSIS — I5189 Other ill-defined heart diseases: Secondary | ICD-10-CM | POA: Insufficient documentation

## 2023-01-20 DIAGNOSIS — I083 Combined rheumatic disorders of mitral, aortic and tricuspid valves: Secondary | ICD-10-CM | POA: Diagnosis not present

## 2023-01-20 LAB — ECHOCARDIOGRAM COMPLETE
AR max vel: 2.43 cm2
AV Area VTI: 5.23 cm2
AV Area mean vel: 2.35 cm2
AV Mean grad: 4.5 mm[Hg]
AV Peak grad: 6.6 mm[Hg]
Ao pk vel: 1.28 m/s
Area-P 1/2: 4.46 cm2
MV VTI: 2.26 cm2
S' Lateral: 2.4 cm

## 2023-01-20 NOTE — Progress Notes (Signed)
*  PRELIMINARY RESULTS* Echocardiogram 2D Echocardiogram has been performed.  Cristela Blue 01/20/2023, 11:33 AM

## 2023-01-24 ENCOUNTER — Ambulatory Visit: Payer: Self-pay

## 2023-01-24 DIAGNOSIS — I340 Nonrheumatic mitral (valve) insufficiency: Secondary | ICD-10-CM

## 2023-01-24 NOTE — Telephone Encounter (Signed)
 Summary: Cardioliogist appt   Pt called and stated that Dr Gasper referred her out to a cardiologist and they can't get her in until March. She wants to know should he refer her to sonmeone else to get in sooner. Please advise.       Reason for Disposition  [1] Follow-up call from patient regarding patient's clinical status AND [2] information NON-URGENT  Answer Assessment - Initial Assessment Questions 1. REASON FOR CALL or QUESTION: What is your reason for calling today? or How can I best help you? or What question do you have that I can help answer?     Pt called and stated that Dr Gasper referred her out to a cardiologist and they can't get her in until March. She wants to know should he refer her to sonmeone else to get in sooner. Please advise. 2. CALLER: Document the source of call. (e.g., laboratory, patient).     Pt  Protocols used: PCP Call - No Triage-A-AH

## 2023-01-27 NOTE — Telephone Encounter (Signed)
 Have place order for [facility] cardiology

## 2023-01-27 NOTE — Addendum Note (Signed)
 Addended by: Malva Limes on: 01/27/2023 04:51 PM   Modules accepted: Orders

## 2023-01-30 DIAGNOSIS — Z859 Personal history of malignant neoplasm, unspecified: Secondary | ICD-10-CM | POA: Diagnosis not present

## 2023-03-28 ENCOUNTER — Encounter: Payer: Self-pay | Admitting: Cardiovascular Disease

## 2023-03-28 ENCOUNTER — Ambulatory Visit: Payer: Medicare Other | Attending: Cardiovascular Disease | Admitting: Cardiovascular Disease

## 2023-03-28 VITALS — BP 140/60 | HR 99 | Ht 61.0 in | Wt 106.0 lb

## 2023-03-28 DIAGNOSIS — I10 Essential (primary) hypertension: Secondary | ICD-10-CM | POA: Diagnosis not present

## 2023-03-28 DIAGNOSIS — R011 Cardiac murmur, unspecified: Secondary | ICD-10-CM | POA: Diagnosis not present

## 2023-03-28 DIAGNOSIS — I340 Nonrheumatic mitral (valve) insufficiency: Secondary | ICD-10-CM

## 2023-03-28 DIAGNOSIS — I5189 Other ill-defined heart diseases: Secondary | ICD-10-CM

## 2023-03-28 NOTE — Patient Instructions (Signed)
 Medication Instructions:  Your physician recommends that you continue on your current medications as directed. Please refer to the Current Medication list given to you today.  *If you need a refill on your cardiac medications before your next appointment, please call your pharmacy*   Lab Work: NONE ordered at this time of appointment    Testing/Procedures: Your physician has requested that you have an echocardiogram 12/2023. Echocardiography is a painless test that uses sound waves to create images of your heart. It provides your doctor with information about the size and shape of your heart and how well your heart's chambers and valves are working. This procedure takes approximately one hour. There are no restrictions for this procedure. Please do NOT wear cologne, perfume, aftershave, or lotions (deodorant is allowed). Please arrive 15 minutes prior to your appointment time.  Please note: We ask at that you not bring children with you during ultrasound (echo/ vascular) testing. Due to room size and safety concerns, children are not allowed in the ultrasound rooms during exams. Our front office staff cannot provide observation of children in our lobby area while testing is being conducted. An adult accompanying a patient to their appointment will only be allowed in the ultrasound room at the discretion of the ultrasound technician under special circumstances. We apologize for any inconvenience.    Follow-Up: At [facility], you and your health needs are our priority.  As part of our continuing mission to provide you with exceptional heart care, we have created designated Provider Care Teams.  These Care Teams include your primary Cardiologist (physician) and Advanced Practice Providers (APPs -  Physician Assistants and Nurse Practitioners) who all work together to provide you with the care you need, when you need it.  We recommend signing up for the patient portal called "MyChart".   Sign up information is provided on this After Visit Summary.  MyChart is used to connect with patients for Virtual Visits (Telemedicine).  Patients are able to view lab/test results, encounter notes, upcoming appointments, etc.  Non-urgent messages can be sent to your provider as well.   To learn more about what you can do with MyChart, go to [email or web address].    Your next appointment:    2 months post echo   Provider:   Dr. Allyson Sabal   Other Instructions

## 2023-03-28 NOTE — Assessment & Plan Note (Signed)
 History of severe mitral regurgitation by 2D echo 01/20/2023.  LV function is normal and LV size is within normal limits.  She also has moderate tricuspid regurgitation.  She is completely asymptomatic.  Will continue to follow this on annual basis.

## 2023-03-28 NOTE — Assessment & Plan Note (Signed)
 2D echo performed 01/20/2023 showed grade 2 diastolic dysfunction.  She is asymptomatic.

## 2023-03-28 NOTE — Assessment & Plan Note (Signed)
 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

## 2023-03-28 NOTE — Progress Notes (Signed)
 03/28/2023 [name]   [birth year]  [id number]  Primary Physician Sherrie Mustache Demetrios Isaacs, MD Primary Cardiologist: Runell Gess MD Nicholes Calamity, [place]  HPI:  Suzanne Morgan is a 83 y.o. thin-appearing married Caucasian female mother of 3 children, grand mother of 6 grandchildren who is accompanied by her daughter Efraim Kaufmann and husband Ron today.  She was referred by Dr. Mila Merry for evaluation of "an auscultated murmur".  She is retired from being a Runner, broadcasting/film/video for the visually impaired.  Her risk factors include treated hypertension.  She is never had a heart attack or stroke.  She denies chest pain or shortness of breath.  She does walk a mile daily without symptoms.  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   Current Meds  Medication Sig   amLODipine (NORVASC) 2.5 MG tablet Take 1 tablet (2.5 mg total) by mouth daily.   calcium carbonate (OS-CAL) 600 MG TABS tablet Take 600 mg by mouth daily with breakfast.    levothyroxine (SYNTHROID) 100 MCG tablet Take 1 tablet (100 mcg total) by mouth daily. TAKE 1 TABLET BY MOUTH EVERY DAY   Multiple Vitamin tablet Take 1 tablet by mouth daily.      Allergies  Allergen Reactions   Clindamycin/Lincomycin Diarrhea   Sulfa Antibiotics    Other Rash    KAPIDEX     Social History   Socioeconomic History   Marital status: Married    Spouse name: Not on file   Number of children: 1   Years of education: 2 adopted children   Highest education level: Bachelor's degree (e.g., BA, AB, BS)  Occupational History   Not on file  Tobacco Use   Smoking status: Never   Smokeless tobacco: Never  Vaping Use   Vaping status: Never Used  Substance and Sexual Activity   Alcohol use: Yes    Alcohol/week: 1.0 - 2.0 standard drink of alcohol     Types: 1 - 2 Glasses of wine per week   Drug use: No   Sexual activity: Not on file  Other Topics Concern   Not on file  Social History Narrative   Pt as 1 biological child and 2 adopted.   Social Drivers of Corporate investment banker Strain: Low Risk  (04/10/2017)   Overall Financial Resource Strain (CARDIA)    Difficulty of Paying Living Expenses: Not hard at all  Food Insecurity: No Food Insecurity (04/10/2017)   Hunger Vital Sign    Worried About Running Out of Food in the Last Year: Never true    Ran Out of Food in the Last Year: Never true  Transportation Needs: No Transportation Needs (04/10/2017)   PRAPARE - Administrator, Civil Service (Medical): No    Lack of Transportation (Non-Medical): No  Physical Activity: Inactive (09/12/2018)   Exercise Vital Sign    Days of Exercise per Week: 0 days    Minutes of Exercise per Session: 0 min  Stress: No Stress Concern Present (09/12/2018)   [employer] of Occupational Health - Occupational Stress Questionnaire    Feeling of Stress : Not at all  Social Connections: Unknown (09/12/2018)   Social Connection and Isolation Panel [NHANES]    Frequency of Communication with Friends and Family: Patient declined    Frequency  of Social Gatherings with Friends and Family: Patient declined    Attends Religious Services: Patient declined    Database administrator or Organizations: Patient declined    Attends Banker Meetings: Patient declined    Marital Status: Patient declined  Intimate Partner Violence: Unknown (09/12/2018)   Humiliation, Afraid, Rape, and Kick questionnaire    Fear of Current or Ex-Partner: Patient declined    Emotionally Abused: Patient declined    Physically Abused: Patient declined    Sexually Abused: Patient declined     Review of Systems: General: negative for chills, fever, night sweats or weight changes.  Cardiovascular: negative for chest pain, dyspnea on exertion, edema,  orthopnea, palpitations, paroxysmal nocturnal dyspnea or shortness of breath Dermatological: negative for rash Respiratory: negative for cough or wheezing Urologic: negative for hematuria Abdominal: negative for nausea, vomiting, diarrhea, bright red blood per rectum, melena, or hematemesis Neurologic: negative for visual changes, syncope, or dizziness All other systems reviewed and are otherwise negative except as noted above.    Blood pressure (!) 140/60, pulse 99, height 5\' 1"  (1.549 m), weight 106 lb (48.1 kg), SpO2 98%.  General appearance: alert and no distress Neck: no adenopathy, no carotid bruit, no JVD, supple, symmetrical, trachea midline, and thyroid not enlarged, symmetric, no tenderness/mass/nodules Lungs: clear to auscultation bilaterally Heart: 2/6 apical systolic murmur radiating to the entire precordium consistent with much regurgitation. Extremities: extremities normal, atraumatic, no cyanosis or edema Pulses: 2+ and symmetric Skin: Skin color, texture, turgor normal. No rashes or lesions Neurologic: Grossly normal  EKG EKG Interpretation Date/Time:  Tuesday March 28 2023 10:48:01 EST Ventricular Rate:  99 PR Interval:  142 QRS Duration:  84 QT Interval:  374 QTC Calculation: 479 R Axis:   7  Text Interpretation: Sinus rhythm with Premature supraventricular complexes Possible Left atrial enlargement Nonspecific ST abnormality No previous ECGs available Confirmed by Nanetta Batty [phone]) on 03/28/2023 10:56:00 AM    ASSESSMENT AND PLAN:   Primary hypertension History of essential hyper treatment on low-dose amlodipine with blood pressure measured today at 140/60.  Severe mitral regurgitation History of severe mitral regurgitation by 2D echo 01/20/2023.  LV function is normal and LV size is within normal limits.  She also has moderate tricuspid regurgitation.  She is completely asymptomatic.  Will continue to follow this on annual basis.  Diastolic  dysfunction 2D echo performed 01/20/2023 showed grade 2 diastolic dysfunction.  She is asymptomatic.     Runell Gess MD FACP,FACC,FAHA, [facility] 03/28/2023 11:08 AM

## 2023-04-04 ENCOUNTER — Other Ambulatory Visit: Payer: Self-pay | Admitting: Family Medicine

## 2023-04-04 DIAGNOSIS — Z1231 Encounter for screening mammogram for malignant neoplasm of breast: Secondary | ICD-10-CM

## 2023-05-16 ENCOUNTER — Other Ambulatory Visit: Payer: Self-pay | Admitting: Family Medicine

## 2023-05-20 ENCOUNTER — Other Ambulatory Visit: Payer: Self-pay | Admitting: Family Medicine

## 2023-05-20 DIAGNOSIS — I10 Essential (primary) hypertension: Secondary | ICD-10-CM

## 2023-06-28 ENCOUNTER — Encounter: Payer: Self-pay | Admitting: Family Medicine

## 2023-06-28 ENCOUNTER — Ambulatory Visit: Payer: Self-pay | Admitting: Family Medicine

## 2023-06-28 VITALS — BP 149/75 | HR 88 | Temp 98.2°F | Wt 107.0 lb

## 2023-06-28 DIAGNOSIS — I10 Essential (primary) hypertension: Secondary | ICD-10-CM | POA: Diagnosis not present

## 2023-06-28 DIAGNOSIS — S9032XA Contusion of left foot, initial encounter: Secondary | ICD-10-CM | POA: Diagnosis not present

## 2023-06-28 DIAGNOSIS — E890 Postprocedural hypothyroidism: Secondary | ICD-10-CM

## 2023-06-28 NOTE — Progress Notes (Signed)
 Established patient visit   Patient: Suzanne Morgan   DOB: [birth year]   83 y.o. Female  MRN: [id number] Visit Date: 06/28/2023  Today's healthcare provider: Jeralene Mom, MD   Chief Complaint  Patient presents with   Medical Management of Chronic Issues    HTN follow-Up   Ankle Problem    Left side, discolored   Subjective    Discussed the use of AI scribe software for clinical note transcription with the patient, who gave verbal consent to proceed.  History of Present Illness   Suzanne Morgan is an 83 year old female with hypertension who presents for a blood pressure check.  Her blood pressure has been running in the 130s when she checks it, although she does not check it very often. She was checking it more frequently before her last visit due to concerns about high readings. She recently acquired a new blood pressure cuff because the previous one was too large for her arm.  She is currently taking amlodipine  2.5mg  daily and has experienced some edema at the end of the day, which has improved since she started taking the medication in the morning. She also experiences nocturia.  She mentions a fall that occurred about one to two months ago in a store, where she tripped and fell backward, injuring her hand slightly. She has had previous issues with her ankle but did not injure it during this fall.  She recently changed the manufacturer of her levothyroxine  and started the new brand on May 19, 2023. She is concerned about how this change might affect her thyroid  function and is expecting to have blood work done to check her levels.  She has a history of high cholesterol, which she attributes to having a high level of 'good cholesterol'. She also notes having a high B12 level, which she attributes to her diet as she does not take supplements.  She reports some discoloration and pain in her left lateral foot, which she attributes to hitting her leg repeatedly while sitting in  a rocking chair. The pain is present when she applies pressure, but the discoloration has resolved     Lab Results  Component Value Date   TSH 1.760 12/02/2022   Lab Results  Component Value Date   NA 140 12/02/2022   K 4.3 12/02/2022   CREATININE 1.01 (H) 12/02/2022   EGFR 55 (L) 12/02/2022   GLUCOSE 102 (H) 12/02/2022     Medications: Outpatient Medications Prior to Visit  Medication Sig   amLODipine  (NORVASC ) 2.5 MG tablet TAKE 1 TABLET BY MOUTH EVERY DAY   calcium carbonate (OS-CAL) 600 MG TABS tablet Take 600 mg by mouth daily with breakfast.    levothyroxine  (SYNTHROID ) 100 MCG tablet TAKE 1 TABLET BY MOUTH DAILY   Multiple Vitamin tablet Take 1 tablet by mouth daily.    No facility-administered medications prior to visit.   Review of Systems     Objective    BP (!) 149/75 (BP Location: Left Arm, Patient Position: Sitting, Cuff Size: Normal)   Pulse 88   Temp 98.2 F (36.8 C) (Oral)   Wt 107 lb (48.5 kg)   SpO2 100%   BMI 20.22 kg/m   Physical Exam   General appearance: Well developed, well nourished female, cooperative and in no acute distress Head: Normocephalic, without obvious abnormality, atraumatic Respiratory: Respirations even and unlabored, normal respiratory rate Extremities: All extremities are intact. No swelling or tenderness of affected left ankle. No  discoloration.  Skin: Skin color, texture, turgor normal. No rashes seen  Psych: Appropriate mood and affect. Neurologic: Mental status: Alert, oriented to person, place, and time, thought content appropriate.    Assessment & Plan     1. Primary hypertension (Primary) She is going to start checking BP weekly at home. If SBP consistently > 130 she will contact me to start hydrochlorothiazide 12.5. continue amlodipine  2.5 for now - Renal function panel  2. Hypothyroidism, postop Has recently had to change manufacturer's of levothyroxine .  - T4 AND TSH  3. Contusion of left foot, initial  encounter Resolved, still slight residual tenderness may having no impact on ambulation.         Jeralene Mom, MD  [facility] [phone] (phone) [phone] (fax)  [facility]

## 2023-06-29 ENCOUNTER — Ambulatory Visit: Payer: Self-pay | Admitting: Family Medicine

## 2023-06-29 ENCOUNTER — Ambulatory Visit
Admission: RE | Admit: 2023-06-29 | Discharge: 2023-06-29 | Disposition: A | Discharge status: Home / Self Care | Source: Ambulatory Visit | Attending: Family Medicine | Admitting: Family Medicine

## 2023-06-29 DIAGNOSIS — Z1231 Encounter for screening mammogram for malignant neoplasm of breast: Secondary | ICD-10-CM | POA: Insufficient documentation

## 2023-06-29 LAB — RENAL FUNCTION PANEL
Albumin: 4.6 g/dL (ref 3.7–4.7)
BUN/Creatinine Ratio: 16 (ref 12–28)
BUN: 14 mg/dL (ref 8–27)
CO2: 24 mmol/L (ref 20–29)
Calcium: 9.6 mg/dL (ref 8.7–10.3)
Chloride: 98 mmol/L (ref 96–106)
Creatinine, Ser: 0.9 mg/dL (ref 0.57–1.00)
Glucose: 102 mg/dL — ABNORMAL HIGH (ref 70–99)
Phosphorus: 3.3 mg/dL (ref 3.0–4.3)
Potassium: 4.4 mmol/L (ref 3.5–5.2)
Sodium: 138 mmol/L (ref 134–144)
eGFR: 63 mL/min/{1.73_m2} (ref >59)

## 2023-06-29 LAB — T4 AND TSH
T4, Total: 9.8 ug/dL (ref 4.5–12.0)
TSH: 2.32 u[IU]/mL (ref 0.450–4.500)

## 2023-08-02 DIAGNOSIS — K080 Exfoliation of teeth due to systemic causes: Secondary | ICD-10-CM | POA: Diagnosis not present

## 2023-08-03 ENCOUNTER — Encounter: Payer: Self-pay | Admitting: Family Medicine

## 2023-08-31 DIAGNOSIS — H26491 Other secondary cataract, right eye: Secondary | ICD-10-CM | POA: Diagnosis not present

## 2023-08-31 DIAGNOSIS — M3501 Sicca syndrome with keratoconjunctivitis: Secondary | ICD-10-CM | POA: Diagnosis not present

## 2023-09-01 ENCOUNTER — Ambulatory Visit (INDEPENDENT_AMBULATORY_CARE_PROVIDER_SITE_OTHER): Admitting: Family Medicine

## 2023-09-01 ENCOUNTER — Encounter: Payer: Self-pay | Admitting: Family Medicine

## 2023-09-01 VITALS — BP 132/64 | HR 86

## 2023-09-01 DIAGNOSIS — I10 Essential (primary) hypertension: Secondary | ICD-10-CM

## 2023-09-22 NOTE — Progress Notes (Signed)
      Established patient visit   Patient: Suzanne Morgan   DOB: 02-Jul-1940   83 y.o. Female  MRN: [id number] Visit Date: 09/01/2023  Today's healthcare provider: Nancyann Perry, MD   Chief Complaint  Patient presents with   Medical Management of Chronic Issues    Patient presents to compare bp readings with home cuff and in office machines.   Subjective    Discussed the use of AI scribe software for clinical note transcription with the patient, who gave verbal consent to proceed.  History of Present Illness   Suzanne Morgan is an 83 year old female with hypertension who presents for blood pressure management.  She is concerned about the discrepancy between her home blood pressure readings and those taken in the office. At home, she uses a smaller cuff purchased online and is unsure of its proper fit. Her home readings are consistently lower than the office readings, which she describes as 'crazy' high.  She measures her blood pressure in the evenings and recalls previously bringing a paper with her readings to the office, though she is uncertain if it was received. She is currently taking amlodipine  2.5 mg daily for her hypertension.       Medications: Outpatient Medications Prior to Visit  Medication Sig   amLODipine  (NORVASC ) 2.5 MG tablet TAKE 1 TABLET BY MOUTH EVERY DAY   calcium carbonate (OS-CAL) 600 MG TABS tablet Take 600 mg by mouth daily with breakfast.    levothyroxine  (SYNTHROID ) 100 MCG tablet TAKE 1 TABLET BY MOUTH DAILY   Multiple Vitamin tablet Take 1 tablet by mouth daily.    No facility-administered medications prior to visit.   Review of Systems     Objective    BP 132/64 (BP Location: Right Arm, Patient Position: Sitting, Cuff Size: Normal)   Pulse 86   Physical Exam   General: Appearance:    Well developed, well nourished female in no acute distress  Eyes:    PERRL, conjunctiva/corneas clear, EOM's intact       Lungs:     Clear to auscultation  bilaterally, respirations unlabored  Heart:    Normal heart rate. Normal rhythm.  2/6 blowing, holosystolic murmur at apex   MS:   All extremities are intact.    Neurologic:   Awake, alert, oriented x 3. No apparent focal neurological defect.         Assessment & Plan       Hypertension Home blood pressure readings lower than office, within acceptable range. No acute management issues. - Continue amlodipine  2.5 mg daily. - Monitor blood pressure at home regularly. - Follow up in November.  General Health Maintenance Discussed RSV, COVID, and flu vaccines. RSV one-time, COVID annually, flu seasonally. - Administer seasonal flu vaccine. - Administer annual COVID vaccine.    No follow-ups on file.     Nancyann Perry, MD  [facility] [phone] (phone) [phone] (fax)  [facility]

## 2023-10-27 DIAGNOSIS — H26491 Other secondary cataract, right eye: Secondary | ICD-10-CM | POA: Diagnosis not present

## 2023-11-09 DIAGNOSIS — H26492 Other secondary cataract, left eye: Secondary | ICD-10-CM | POA: Diagnosis not present

## 2023-11-27 DIAGNOSIS — H43813 Vitreous degeneration, bilateral: Secondary | ICD-10-CM | POA: Diagnosis not present

## 2023-11-27 DIAGNOSIS — M3501 Sicca syndrome with keratoconjunctivitis: Secondary | ICD-10-CM | POA: Diagnosis not present

## 2023-12-06 ENCOUNTER — Encounter: Payer: Self-pay | Admitting: Family Medicine

## 2023-12-28 ENCOUNTER — Ambulatory Visit: Attending: Cardiovascular Disease

## 2023-12-28 DIAGNOSIS — I340 Nonrheumatic mitral (valve) insufficiency: Secondary | ICD-10-CM

## 2023-12-28 LAB — ECHOCARDIOGRAM COMPLETE
Area-P 1/2: 4.39 cm2
S' Lateral: 2.87 cm

## 2023-12-29 ENCOUNTER — Ambulatory Visit: Payer: Self-pay | Admitting: Cardiovascular Disease

## 2023-12-29 ENCOUNTER — Ambulatory Visit: Admitting: Family Medicine

## 2023-12-29 ENCOUNTER — Encounter: Payer: Self-pay | Admitting: Student

## 2023-12-29 ENCOUNTER — Encounter: Payer: Self-pay | Admitting: Family Medicine

## 2023-12-29 ENCOUNTER — Ambulatory Visit: Attending: Student | Admitting: Student

## 2023-12-29 VITALS — BP 148/70 | HR 92 | Ht 60.0 in | Wt 105.0 lb

## 2023-12-29 VITALS — BP 122/67 | HR 80 | Resp 16 | Ht 60.5 in | Wt 103.6 lb

## 2023-12-29 DIAGNOSIS — I340 Nonrheumatic mitral (valve) insufficiency: Secondary | ICD-10-CM

## 2023-12-29 DIAGNOSIS — D696 Thrombocytopenia, unspecified: Secondary | ICD-10-CM | POA: Diagnosis not present

## 2023-12-29 DIAGNOSIS — I341 Nonrheumatic mitral (valve) prolapse: Secondary | ICD-10-CM | POA: Diagnosis not present

## 2023-12-29 DIAGNOSIS — I10 Essential (primary) hypertension: Secondary | ICD-10-CM

## 2023-12-29 DIAGNOSIS — M8580 Other specified disorders of bone density and structure, unspecified site: Secondary | ICD-10-CM | POA: Diagnosis not present

## 2023-12-29 DIAGNOSIS — E890 Postprocedural hypothyroidism: Secondary | ICD-10-CM | POA: Diagnosis not present

## 2023-12-29 DIAGNOSIS — Z0000 Encounter for general adult medical examination without abnormal findings: Secondary | ICD-10-CM

## 2023-12-29 DIAGNOSIS — L989 Disorder of the skin and subcutaneous tissue, unspecified: Secondary | ICD-10-CM | POA: Insufficient documentation

## 2023-12-29 DIAGNOSIS — Z0001 Encounter for general adult medical examination with abnormal findings: Secondary | ICD-10-CM | POA: Diagnosis not present

## 2023-12-29 NOTE — Progress Notes (Signed)
 Complete physical exam   Patient: Suzanne Morgan   DOB: 1940-05-12   83 y.o. Female  MRN: [id number] Visit Date: 12/29/2023  Today's healthcare provider: Nancyann Perry, MD   Chief Complaint  Patient presents with   Medicare Wellness   Subjective    Discussed the use of AI scribe software for clinical note transcription with the patient, who gave verbal consent to proceed.  History of Present Illness   Suzanne Morgan is an 83 year old female with hypertension and severe mitral regurgitation who presents for a yearly physical.  She is in the process of moving to [place] to be closer to her children and plans to transfer her prescriptions to a pharmacy there. Her medications for thyroid  and blood pressure are due for refills in April, and she reports no issues with their effectiveness.  She recently underwent an echocardiogram and is awaiting further information about the results. She is concerned about the results but has no chest pain, heart flutters, or shortness of breath. She is currently under the care of a cardiologist.  She has noticed a spot on the tip of her nose for the past one to two months. It is not sore or painful, but she is concerned due to her past skin issues on her legs. She is considering consulting a dermatologist in [place] for further evaluation.  She maintains an active lifestyle, attempting to walk daily, although she has been less successful recently. No shortness of breath or changes in bowel habits. She is up to date with her flu and COVID vaccinations.     Lab Results  Component Value Date   TSH 2.320 06/28/2023   Lab Results  Component Value Date   NA 138 06/28/2023   K 4.4 06/28/2023   CREATININE 0.90 06/28/2023   EGFR 63 06/28/2023   GLUCOSE 102 (H) 06/28/2023       Past Medical History:  Diagnosis Date   Heart murmur 1964   Herpes zoster without complication 12/12/2008   History of chicken pox    History of mumps     Hypertension    Hypothyroidism    Mitral valve prolapse    Squamous cell carcinoma    right lower leg. excised by Dr. Cathlyn   Past Surgical History:  Procedure Laterality Date   BREAST CYST ASPIRATION Right many yrs ago   negative   CATARACT EXTRACTION W/PHACO Left 03/03/2020   Procedure: CATARACT EXTRACTION PHACO AND INTRAOCULAR LENS PLACEMENT (IOC) LEFT 4.84 00:40.3 ;  Surgeon: Jaye Fallow, MD;  Location: [facility];  Service: Ophthalmology;  Laterality: Left;   CATARACT EXTRACTION W/PHACO Right 03/17/2020   Procedure: CATARACT EXTRACTION PHACO AND INTRAOCULAR LENS PLACEMENT (IOC) RIGHT 11.27 01:01.9;  Surgeon: Jaye Fallow, MD;  Location: [facility];  Service: Ophthalmology;  Laterality: Right;   COLONOSCOPY WITH PROPOFOL  N/A 11/30/2017   Procedure: COLONOSCOPY WITH PROPOFOL ;  Surgeon: Janalyn Keene NOVAK, MD;  Location: [facility];  Service: Endoscopy;  Laterality: N/A;   EYE SURGERY     SQUAMOUS CELL CARCINOMA EXCISION Right    Right lateral lower leg Dr. Cathlyn   THYROIDECTOMY     UPPER GI ENDOSCOPY  06/13/2007   HIATAL HERNIA, IRREGEULAR Z-LINE   Social History   Socioeconomic History   Marital status: Married    Spouse name: Not on file   Number of children: 1   Years of education: 2 adopted children   Highest education level: Bachelor's degree (e.g., BA, AB, BS)  Occupational History   Not on file  Tobacco Use   Smoking status: Never   Smokeless tobacco: Never  Vaping Use   Vaping status: Never Used  Substance and Sexual Activity   Alcohol use: Not Currently    Alcohol/week: 1.0 - 2.0 standard drink of alcohol    Comment: None   Drug use: Never   Sexual activity: Not Currently    Birth control/protection: None  Other Topics Concern   Not on file  Social History Narrative   Pt as 1 biological child and 2 adopted.   Social Drivers of Corporate Investment Banker Strain: Low Risk  (12/23/2023)   Overall  Financial Resource Strain (CARDIA)    Difficulty of Paying Living Expenses: Not hard at all  Food Insecurity: No Food Insecurity (12/29/2023)   Hunger Vital Sign    Worried About Running Out of Food in the Last Year: Never true    Ran Out of Food in the Last Year: Never true  Transportation Needs: No Transportation Needs (12/29/2023)   PRAPARE - Administrator, Civil Service (Medical): No    Lack of Transportation (Non-Medical): No  Physical Activity: Sufficiently Active (12/29/2023)   Exercise Vital Sign    Days of Exercise per Week: 7 days    Minutes of Exercise per Session: 30 min  Stress: No Stress Concern Present (12/29/2023)   [employer] of Occupational Health - Occupational Stress Questionnaire    Feeling of Stress: Only a little  Social Connections: Moderately Isolated (12/29/2023)   Social Connection and Isolation Panel    Frequency of Communication with Friends and Family: Twice a week    Frequency of Social Gatherings with Friends and Family: More than three times a week    Attends Religious Services: Patient declined    Database Administrator or Organizations: No    Attends Banker Meetings: Patient declined    Marital Status: Married  Catering Manager Violence: Not At Risk (12/29/2023)   Humiliation, Afraid, Rape, and Kick questionnaire    Fear of Current or Ex-Partner: No    Emotionally Abused: No    Physically Abused: No    Sexually Abused: No   Family Status  Relation Name Status   Mother Pudge Deceased at age 5       CVA   Father Marinell Deceased at age 33       MI   Brother  Deceased at age 16   Neg Hx  (Not Specified)  No partnership data on file   Family History  Problem Relation Age of Onset   Stroke Mother    Heart attack Mother    Heart attack Father    Early death Brother    Breast cancer Neg Hx    Allergies  Allergen Reactions   Clindamycin/Lincomycin Diarrhea   Sulfa Antibiotics    Other Rash    KAPIDEX      Patient Care Team: Gasper Nancyann BRAVO, MD as PCP - General (Family Medicine) [place], [facility] as Consulting Physician (Optometry) [place], [place], MD (Dermatology) Janalyn Keene NOVAK, MD (Inactive) as Consulting Physician (Gastroenterology)   Medications: Outpatient Medications Prior to Visit  Medication Sig   amLODipine  (NORVASC ) 2.5 MG tablet TAKE 1 TABLET BY MOUTH EVERY DAY   calcium carbonate (OS-CAL) 600 MG TABS tablet Take 600 mg by mouth daily with breakfast.    levothyroxine  (SYNTHROID ) 100 MCG tablet TAKE 1 TABLET BY MOUTH DAILY   Multiple Vitamin tablet Take  1 tablet by mouth daily.    No facility-administered medications prior to visit.    Review of Systems  Constitutional:  Negative for appetite change, chills, fatigue and fever.  Respiratory:  Negative for chest tightness and shortness of breath.   Cardiovascular:  Negative for chest pain and palpitations.  Gastrointestinal:  Negative for abdominal pain, nausea and vomiting.  Neurological:  Negative for dizziness and weakness.      Objective    BP 122/67 (BP Location: Left Arm, Patient Position: Sitting, Cuff Size: Normal) Comment: at home  Pulse 80   Resp 16   Ht 5' 0.5 (1.537 m)   Wt 103 lb 9.6 oz (47 kg)   SpO2 100%   BMI 19.90 kg/m    Physical Exam  General Appearance:    Thin female. Alert, cooperative, in no acute distress, appears stated age   Head:    Normocephalic, without obvious abnormality, atraumatic  Eyes:    PERRL, conjunctiva/corneas clear, EOM's intact, fundi    benign, both eyes  Ears:    Normal TM's and external ear canals, both ears  Nose:   Nares normal, septum midline, mucosa normal, no drainage    or sinus tenderness. Faint flesh colored circular lesion with telangiectasia right tip of nose suspicious for BCC.   Throat:   Lips, mucosa, and tongue normal; teeth and gums normal  Neck:   Supple, symmetrical, trachea midline, no adenopathy;    thyroid :  no  enlargement/tenderness/nodules; no carotid   bruit or JVD  Back:     Symmetric, no curvature, ROM normal, no CVA tenderness  Lungs:     Clear to auscultation bilaterally, respirations unlabored  Chest Wall:    No tenderness or deformity   Heart:    Normal heart rate. Normal rhythm.  2/6 blowing, holosystolic murmur at apex  Breast Exam:    deferred  Abdomen:     Soft, non-tender, bowel sounds active all four quadrants,    no masses, no organomegaly  Pelvic:    deferred  Extremities:   All extremities are intact. No cyanosis or edema  Pulses:   2+ and symmetric all extremities  Skin:   Skin color, texture, turgor normal, no rashes or lesions  Lymph nodes:   Cervical, supraclavicular, and axillary nodes normal  Neurologic:   CNII-XII intact, normal strength, sensation and reflexes    throughout       Last depression screening scores    06/28/2023   10:37 AM 12/02/2022    9:48 AM 05/16/2022    9:38 AM  PHQ 2/9 Scores  PHQ - 2 Score 0 0 0  PHQ- 9 Score  1  1      Data saved with a previous flowsheet row definition   Last fall risk screening    12/29/2023    8:25 AM  Fall Risk   Falls in the past year? 0  Number falls in past yr: 0  Injury with Fall? 0  Risk for fall due to : No Fall Risks  Follow up Falls evaluation completed   Last Audit-C alcohol use screening    12/23/2023    9:53 AM  Alcohol Use Disorder Test (AUDIT)  1. How often do you have a drink containing alcohol? 0  3. How often do you have six or more drinks on one occasion? 0   A score of 3 or more in women, and 4 or more in men indicates increased risk for alcohol abuse, EXCEPT if all of  the points are from question 1   No results found for any visits on 12/29/23.  Assessment & Plan    Routine Health Maintenance and Physical Exam  Exercise Activities and Dietary recommendations  Goals      DIET - EAT MORE FRUITS AND VEGETABLES     DIET - INCREASE WATER INTAKE     Recommend increasing water intake to  4 glasses a day.         Immunization History  Administered Date(s) Administered   Fluad Quad(high Dose 65+) 10/23/2018, 10/26/2021   Hepatitis B 10/11/1991, 11/13/1991, 04/09/1992   INFLUENZA, HIGH DOSE SEASONAL PF 10/30/2015, 10/19/2017, 10/09/2019, 10/15/2020, 10/17/2022, 10/16/2023   Influenza Split 11/02/2006   Influenza-Unspecified 11/14/2016   PFIZER Comirnaty(Gray Top)Covid-19 Tri-Sucrose Vaccine 05/21/2020   PFIZER(Purple Top)SARS-COV-2 Vaccination 01/31/2019, 02/21/2019, 11/07/2019   Pfizer Covid-19 Vaccine Bivalent Booster 7yrs & up 11/04/2020   Pfizer(Comirnaty)Fall Seasonal Vaccine 12 years and older 10/26/2021, 10/17/2022   Pneumococcal Conjugate-13 03/20/2014   Pneumococcal Polysaccharide-23 11/19/2003, 12/27/2010   Respiratory Syncytial Virus Vaccine,Recomb Aduvanted(Arexvy) 10/11/2021   Td 12/09/2008   Tdap 02/17/2020   Unspecified SARS-COV-2 Vaccination 10/16/2023   Zoster Recombinant(Shingrix ) 04/13/2020, 09/17/2020   Zoster, Live 02/08/2006    Health Maintenance  Topic Date Due   Medicare Annual Wellness (AWV)  12/02/2023   COVID-19 Vaccine (9 - 2025-26 season) 04/14/2024   Bone Density Scan  05/16/2024   DTaP/Tdap/Td (3 - Td or Tdap) 02/16/2030   Pneumococcal Vaccine: 50+ Years  Completed   Influenza Vaccine  Completed   Zoster Vaccines- Shingrix   Completed   Meningococcal B Vaccine  Aged Out   Hepatitis B Vaccines 19-59 Average Risk  Discontinued   Colonoscopy  Discontinued    Discussed health benefits of physical activity, and encouraged her to engage in regular exercise appropriate for her age and condition.     Adult Wellness Visit Annual wellness visit completed. Transitioning care to new PCP in [place]. No issues with current medications. - Ordered blood work for thyroid , cholesterol, kidney function. - Transfer prescriptions to [facility] - Establish care with new PCP in [place].  Severe mitral regurgitation Echocardiogram  consistent with previous findings. No heart failure symptoms. Heart function stable. - Follow up with cardiology this afternoon as scheduled.   Essential hypertension Blood pressure adequately controlled with current medication. No issues with adherence or side effects. - Continue current antihypertensive medication regimen.  Postprocedural hypothyroidism Thyroid  function stable with current medication. No issues with adherence or side effects. - Continue current thyroid  medication regimen.  Thrombocytopenis - Check CBC  Suspected basal cell carcinoma of the nose Lesion on nose suspected to be basal cell carcinoma. Requires treatment to prevent progression. - Referred to dermatologist for evaluation and potential treatment.  Disorder of bone density and structure Due for bone density test next year. - Schedule bone density test for next year through new PCP in [place].            Nancyann Perry, MD  [facility] [phone] (phone) [phone] (fax)  [facility]

## 2023-12-29 NOTE — Progress Notes (Signed)
 Cardiology Clinic Note   Date: 12/29/2023 ID: [name], [name] 06-01-1940, MRN [id number]  Primary Cardiologist:  Dorn Lesches, MD  Chief Complaint   Suzanne Morgan is a 83 y.o. female who presents to the clinic today for follow up after echo.   Patient Profile   Suzanne Morgan is followed by Dr. Lesches for the history outlined below.      Past medical history significant for: Mitral valve prolapse/diastolic dysfunction. Echo 12/28/2023: EF 60 to 65%.  Regional wall motion could not be evaluated.  Grade II DD.  Normal global strain.  Normal RV size/function.  Moderately elevated PA pressure, RVSP 48.4 mmHg.  The mitral valve is degenerative.  Severe mitral regurgitation.  Mild holosystolic prolapse of both leaflets of the mitral valve.  Severe MAC.  Moderate TR.  Aortic valve sclerosis without stenosis.  Moderate pulmonic valve regurgitation. Hypertension. GERD. Osteopenia.  In summary, patient was first evaluated by Dr. Lesches on 03/28/2023 for mitral valve prolapse.  Patient underwent echo in December 2024 ordered by PCP which demonstrated normal LV/RV function, Grade II DD, severe MR, mild holosystolic prolapse of multiple scallop of the posterior leaflet of the mitral valve.  She was asymptomatic.  Repeat echo 12/28/2023 showed severe MR and Grade II DD as detailed above.     History of Present Illness    Today, patient is here alone. She is doing well. Patient denies shortness of breath, dyspnea on exertion, orthopnea or PND. She reports occasional ankle edema that is not present in the mornings and progresses throughout the day. I look like Greta Garbo in the mornings. If she is able to elevate feet throughout the day it is better. No chest pain, pressure, or tightness. No palpitations. She has one episodes of lightheadedness while walking for which she had to steady herself against her husband. This occurred in the setting of a recent laser eye treatment. No other episode since.  No dizziness, presyncope or syncope. BP is elevated today. She brings in home readings and they typically run in the 120s/70s. She states BP always high at provider visits.  She is active walking 1 mile a day. She and her husband are moving to [place]. She plans on establishing with cardiology there. Will make sure she signs a release for records.     ROS: All other systems reviewed and are otherwise negative except as noted in History of Present Illness.  EKGs/Labs Reviewed       EKG is not performed today.   06/28/2023: BUN 14; Creatinine, Ser 0.90; Potassium 4.4; Sodium 138   06/28/2023: TSH 2.320    Risk Assessment/Calculations      HYPERTENSION CONTROL Vitals:   12/29/23 1350 12/29/23 1524  BP: (!) 159/84 (!) 148/70    The patient's blood pressure is elevated above target today.  In order to address the patient's elevated BP: The blood pressure is usually elevated in clinic.  Blood pressures monitored at home have been optimal.           Physical Exam    VS:  BP (!) 148/70 (BP Location: Left Arm, Patient Position: Sitting, Cuff Size: Normal)   Pulse 92   Ht 5' (1.524 m)   Wt 105 lb (47.6 kg)   SpO2 98%   BMI 20.51 kg/m  , BMI Body mass index is 20.51 kg/m.  GEN: Well nourished, well developed, in no acute distress. Neck: No JVD or carotid bruits. Cardiac:  RRR. 3/6 systolic murmur. No rubs  or gallops.   Respiratory:  Respirations regular and unlabored. Clear to auscultation without rales, wheezing or rhonchi. GI: Soft, nontender, nondistended. Extremities: Radials/DP/PT 2+ and equal bilaterally. No clubbing or cyanosis. No edema   Skin: Warm and dry, no rash. Neuro: Strength intact.  Assessment & Plan   Mitral valve prolapse/diastolic dysfunction Echo 12/28/2023 demonstrated normal LV/RV function, Grade II DD, moderately elevated PA pressure, severe MR, mild holosystolic prolapse of both leaflets of the mitral valve, moderate TR, aortic valve sclerosis without  stenosis.  Patient denies shortness of breath, DOE, orthopnea, PND, lightheadedness, dizziness, presyncope or syncope. She walks 1 mile a day with good tolerance. She is moving to [place] and plans to establish with cardiology there. She will sign a release of records today in anticipation of the request. 3/6 systolic murmur. - Establish with cardiology in [place].  - Repeat echo in 1 year.  Hypertension BP today 159/84 on intake and 148/70 on my recheck. Home BP typically 120s/70s. No report of headaches or dizziness.  - Continue amlodipine .  Disposition: Patient moving to [place]. Return as needed.          Signed, Barnie HERO. Isa Kohlenberg, DNP, NP-C

## 2023-12-29 NOTE — Progress Notes (Signed)
 Chief Complaint  Patient presents with   Medicare Wellness     Subjective:   Suzanne Morgan is a 83 y.o. female who presents for a Medicare Annual Wellness Visit.  Visit info / Clinical Intake: Medicare Wellness Visit Type:: Subsequent Annual Wellness Visit Persons participating in visit and providing information:: patient Medicare Wellness Visit Mode:: In-person (required for WTM) Interpreter Needed?: No Pre-visit prep was completed: yes AWV questionnaire completed by patient prior to visit?: yes (some) Date:: 12/23/23 Living arrangements:: lives with spouse/significant other Patient's Overall Health Status Rating: very good Typical amount of pain: none Does pain affect daily life?: no Are you currently prescribed opioids?: no  Dietary Habits and Nutritional Risks How many meals a day?: 3 Eats fruit and vegetables daily?: yes Most meals are obtained by: preparing own meals In the last 2 weeks, have you had any of the following?: none Diabetic:: no  Functional Status Activities of Daily Living (to include ambulation/medication): Independent Ambulation: Independent Medication Administration: Independent Home Management (perform basic housework or laundry): Independent Manage your own finances?: yes Primary transportation is: driving Concerns about vision?: no *vision screening is required for WTM* (patient had cataracts treatment and lasix surgery)  Fall Screening Falls in the past year?: 0 Number of falls in past year: 0 Was there an injury with Fall?: 0 Fall Risk Category Calculator: 0 Patient Fall Risk Level: Low Fall Risk  Fall Risk Patient at Risk for Falls Due to: No Fall Risks Fall risk Follow up: Falls evaluation completed  Home and Transportation Safety: All rugs have non-skid backing?: N/A, no rugs All stairs or steps have railings?: yes Grab bars in the bathtub or shower?: yes Have non-skid surface in bathtub or shower?: yes Good home lighting?:  yes Regular seat belt use?: yes Hospital stays in the last year:: no  Cognitive Assessment Difficulty concentrating, remembering, or making decisions? : no Will 6CIT or Mini Cog be Completed: no 6CIT or Mini Cog Declined: patient alert, oriented, able to answer questions appropriately and recall recent events  Advance Directives (For Healthcare) Does Patient Have a Medical Advance Directive?: Yes Type of Advance Directive: Healthcare Power of Attorney  Reviewed/Updated  Reviewed/Updated: Reviewed All (Medical, Surgical, Family, Medications, Allergies, Care Teams, Patient Goals)    Allergies (verified) Clindamycin/lincomycin, Sulfa antibiotics, and Other   Current Medications (verified) Outpatient Encounter Medications as of 12/29/2023  Medication Sig   amLODipine  (NORVASC ) 2.5 MG tablet TAKE 1 TABLET BY MOUTH EVERY DAY   calcium carbonate (OS-CAL) 600 MG TABS tablet Take 600 mg by mouth daily with breakfast.    levothyroxine  (SYNTHROID ) 100 MCG tablet TAKE 1 TABLET BY MOUTH DAILY   Multiple Vitamin tablet Take 1 tablet by mouth daily.    No facility-administered encounter medications on file as of 12/29/2023.    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 Cathlyn   Past Surgical History:  Procedure Laterality Date   BREAST CYST ASPIRATION Right many yrs ago   negative   CATARACT EXTRACTION W/PHACO Left 03/03/2020   Procedure: CATARACT EXTRACTION PHACO AND INTRAOCULAR LENS PLACEMENT (IOC) LEFT 4.84 00:40.3 ;  Surgeon: Jaye Fallow, MD;  Location: [facility];  Service: Ophthalmology;  Laterality: Left;  CATARACT EXTRACTION W/PHACO Right 03/17/2020   Procedure: CATARACT EXTRACTION PHACO AND INTRAOCULAR LENS  PLACEMENT (IOC) RIGHT 11.27 01:01.9;  Surgeon: Jaye Fallow, MD;  Location: [facility];  Service: Ophthalmology;  Laterality: Right;   COLONOSCOPY WITH PROPOFOL  N/A 11/30/2017   Procedure: COLONOSCOPY WITH PROPOFOL ;  Surgeon: Janalyn Keene NOVAK, MD;  Location: [facility];  Service: Endoscopy;  Laterality: N/A;   EYE SURGERY     SQUAMOUS CELL CARCINOMA EXCISION Right    Right lateral lower leg Dr. Cathlyn   THYROIDECTOMY     UPPER GI ENDOSCOPY  06/13/2007   HIATAL HERNIA, IRREGEULAR Z-LINE   Family History  Problem Relation Age of Onset   Stroke Mother    Heart attack Mother    Heart attack Father    Early death Brother    Breast cancer Neg Hx    Social History   Occupational History   Not on file  Tobacco Use   Smoking status: Never   Smokeless tobacco: Never  Vaping Use   Vaping status: Never Used  Substance and Sexual Activity   Alcohol use: Not Currently    Alcohol/week: 1.0 - 2.0 standard drink of alcohol    Comment: None   Drug use: Never   Sexual activity: Not Currently    Birth control/protection: None   Tobacco Counseling Counseling given: Not Answered  SDOH Screenings   Food Insecurity: No Food Insecurity (12/29/2023)  Housing: Low Risk  (12/29/2023)  Transportation Needs: No Transportation Needs (12/29/2023)  Utilities: Not At Risk (12/29/2023)  Alcohol Screen: Low Risk  (11/24/2021)  Depression (PHQ2-9): Low Risk  (12/29/2023)  Financial Resource Strain: Low Risk  (12/23/2023)  Physical Activity: Sufficiently Active (12/29/2023)  Social Connections: Moderately Isolated (12/29/2023)  Stress: No Stress Concern Present (12/29/2023)  Tobacco Use: Low Risk  (12/29/2023)  Health Literacy: Adequate Health Literacy (12/29/2023)   See flowsheets for full screening details  Depression Screen PHQ 2 & 9 Depression Scale- Over the past 2 weeks, how often have you been bothered by any of the following problems? Little interest or pleasure in doing  things: 0 Feeling down, depressed, or hopeless (PHQ Adolescent also includes...irritable): 0 PHQ-2 Total Score: 0     Goals Addressed             This Visit's Progress    DIET - EAT MORE FRUITS AND VEGETABLES               Objective:    Today's Vitals   12/29/23 0833 12/29/23 0835  BP: (!) 141/67 122/67  Pulse: 80   Resp: 16   SpO2: 100%   Weight: 103 lb 9.6 oz (47 kg)   Height: 5' 0.5 (1.537 m)   PainSc: 0-No pain    Body mass index is 19.9 kg/m.  Hearing/Vision screen Vision Screening - Comments:: 11/2023-[facility] Immunizations and Health Maintenance Health Maintenance  Topic Date Due   COVID-19 Vaccine (9 - 2025-26 season) 04/14/2024   Bone Density Scan  05/16/2024   Medicare Annual Wellness (AWV)  12/28/2024   DTaP/Tdap/Td (3 - Td or Tdap) 02/16/2030   Pneumococcal Vaccine: 50+ Years  Completed   Influenza Vaccine  Completed   Zoster Vaccines- Shingrix   Completed   Meningococcal B Vaccine  Aged Out   Hepatitis B Vaccines 19-59 Average Risk  Discontinued   Colonoscopy  Discontinued        Assessment/Plan:  This is a routine wellness examination for Tarae.  Patient Care Team: Gasper Nancyann BRAVO, MD as  PCP - General (Family Medicine) [facility], [facility] as Consulting Physician (Optometry) Cathlyn Seal, MD (Dermatology) Janalyn Keene NOVAK, MD (Inactive) as Consulting Physician (Gastroenterology)  I have personally reviewed and noted the following in the patient's chart:   Medical and social history Use of alcohol, tobacco or illicit drugs  Current medications and supplements including opioid prescriptions. Functional ability and status Nutritional status Physical activity Advanced directives List of other physicians Hospitalizations, surgeries, and ER visits in previous 12 months Vitals Screenings to include cognitive, depression, and falls Referrals and appointments  Orders Placed This Encounter  Procedures   CBC    Comprehensive metabolic panel with GFR   Lipid panel   TSH + free T4   VITAMIN D  25 Hydroxy (Vit-D Deficiency, Fractures)   In addition, I have reviewed and discussed with patient certain preventive protocols, quality metrics, and best practice recommendations. A written personalized care plan for preventive services as well as general preventive health recommendations were provided to patient.   Nancyann Perry, MD   12/29/2023   No follow-ups on file.  After Visit Summary: (In Person-Printed) AVS printed and given to the patient

## 2023-12-29 NOTE — Patient Instructions (Signed)
 Medication Instructions:   Your physician recommends that you continue on your current medications as directed. Please refer to the Current Medication list given to you today.    *If you need a refill on your cardiac medications before your next appointment, please call your pharmacy*  Lab Work:  None ordered at this time   If you have labs (blood work) drawn today and your tests are completely normal, you will receive your results only by:  MyChart Message (if you have MyChart) OR  A paper copy in the mail If you have any lab test that is abnormal or we need to change your treatment, we will call you to review the results.  Testing/Procedures:  None ordered at this time   Referrals:  None ordered at this time   Follow-Up:  At [facility], you and your health needs are our priority.  As part of our continuing mission to provide you with exceptional heart care, our providers are all part of one team.  This team includes your primary Cardiologist (physician) and Advanced Practice Providers or APPs (Physician Assistants and Nurse Practitioners) who all work together to provide you with the care you need, when you need it.  Your next appointment:    As Needed  Provider:    Barnie Hila, NP    We recommend signing up for the patient portal called MyChart.  Sign up information is provided on this After Visit Summary.  MyChart is used to connect with patients for Virtual Visits (Telemedicine).  Patients are able to view lab/test results, encounter notes, upcoming appointments, etc.  Non-urgent messages can be sent to your provider as well.   To learn more about what you can do with MyChart, go to [email or web address].

## 2023-12-29 NOTE — Patient Instructions (Addendum)
 Please review the attached list of medications and notify my office if there are any errors.   Please bring all of your medications to every appointment so we can make sure that our medication list is the same as yours.   Be sure to make an appointment with a dermatologist to evaluate the lesion on your nose.   You will be due for a bone density test in April of 2026

## 2023-12-30 LAB — LIPID PANEL
Chol/HDL Ratio: 1.8 ratio (ref 0.0–4.4)
Cholesterol, Total: 235 mg/dL — ABNORMAL HIGH (ref 100–199)
HDL: 132 mg/dL (ref >39)
LDL Chol Calc (NIH): 93 mg/dL (ref 0–99)
Triglycerides: 57 mg/dL (ref 0–149)
VLDL Cholesterol Cal: 10 mg/dL (ref 5–40)

## 2023-12-30 LAB — COMPREHENSIVE METABOLIC PANEL WITH GFR
ALT: 32 IU/L (ref 0–32)
AST: 38 IU/L (ref 0–40)
Albumin: 4.6 g/dL (ref 3.7–4.7)
Alkaline Phosphatase: 95 IU/L (ref 48–129)
BUN/Creatinine Ratio: 18 (ref 12–28)
BUN: 17 mg/dL (ref 8–27)
Bilirubin Total: 0.7 mg/dL (ref 0.0–1.2)
CO2: 27 mmol/L (ref 20–29)
Calcium: 9.7 mg/dL (ref 8.7–10.3)
Chloride: 100 mmol/L (ref 96–106)
Creatinine, Ser: 0.95 mg/dL (ref 0.57–1.00)
Globulin, Total: 2.4 g/dL (ref 1.5–4.5)
Glucose: 89 mg/dL (ref 70–99)
Potassium: 4.3 mmol/L (ref 3.5–5.2)
Sodium: 141 mmol/L (ref 134–144)
Total Protein: 7 g/dL (ref 6.0–8.5)
eGFR: 59 mL/min/1.73 — ABNORMAL LOW (ref >59)

## 2023-12-30 LAB — VITAMIN D 25 HYDROXY (VIT D DEFICIENCY, FRACTURES): Vit D, 25-Hydroxy: 57.9 ng/mL (ref 30.0–100.0)

## 2023-12-30 LAB — TSH+FREE T4
Free T4: 1.8 ng/dL — ABNORMAL HIGH (ref 0.82–1.77)
TSH: 2.04 u[IU]/mL (ref 0.450–4.500)

## 2023-12-30 LAB — CBC
Hematocrit: 44.8 % (ref 34.0–46.6)
Hemoglobin: 14.8 g/dL (ref 11.1–15.9)
MCH: 31 pg (ref 26.6–33.0)
MCHC: 33 g/dL (ref 31.5–35.7)
MCV: 94 fL (ref 79–97)
Platelets: 146 x10E3/uL — ABNORMAL LOW (ref 150–450)
RBC: 4.78 x10E6/uL (ref 3.77–5.28)
RDW: 11.2 % — ABNORMAL LOW (ref 11.7–15.4)
WBC: 6 x10E3/uL (ref 3.4–10.8)

## 2023-12-31 ENCOUNTER — Ambulatory Visit: Payer: Self-pay | Admitting: Family Medicine

## 2024-03-26 ENCOUNTER — Ambulatory Visit: Admitting: Cardiovascular Disease
